# Patient Record
Sex: Female | Born: 1980
Health system: Southern US, Community
[De-identification: ages and names within clinical notes are randomized; demographics above are authoritative.]

## PROBLEM LIST (undated history)

## (undated) ENCOUNTER — Inpatient Hospital Stay (HOSPITAL_COMMUNITY): Payer: Self-pay

## (undated) DIAGNOSIS — E039 Hypothyroidism, unspecified: Secondary | ICD-10-CM

## (undated) DIAGNOSIS — F909 Attention-deficit hyperactivity disorder, unspecified type: Secondary | ICD-10-CM

## (undated) DIAGNOSIS — G43909 Migraine, unspecified, not intractable, without status migrainosus: Secondary | ICD-10-CM

## (undated) DIAGNOSIS — F419 Anxiety disorder, unspecified: Secondary | ICD-10-CM

## (undated) HISTORY — DX: Anxiety disorder, unspecified: F41.9

## (undated) HISTORY — DX: Attention-deficit hyperactivity disorder, unspecified type: F90.9

## (undated) HISTORY — DX: Migraine, unspecified, not intractable, without status migrainosus: G43.909

## (undated) HISTORY — PX: WISDOM TOOTH EXTRACTION: SHX21

---

## 2012-02-06 LAB — OB RESULTS CONSOLE ANTIBODY SCREEN: Antibody Screen: NEGATIVE

## 2012-02-06 LAB — OB RESULTS CONSOLE HEPATITIS B SURFACE ANTIGEN: Hepatitis B Surface Ag: NEGATIVE

## 2012-02-06 LAB — OB RESULTS CONSOLE RUBELLA ANTIBODY, IGM: Rubella: IMMUNE

## 2012-02-06 LAB — OB RESULTS CONSOLE RPR: RPR: NONREACTIVE

## 2012-02-06 LAB — OB RESULTS CONSOLE ABO/RH: RH Type: POSITIVE

## 2012-02-13 LAB — OB RESULTS CONSOLE GC/CHLAMYDIA: Chlamydia: NEGATIVE

## 2012-03-20 NOTE — L&D Delivery Note (Signed)
Delivery Note  First Stage: Labor onset: 1630 Augmentation : none Analgesia /Anesthesia intrapartum: none AROM at 1922  Entered tub after AROM  Water temperature  98.7  Second Stage: Complete dilation at 1925 Onset of pushing at 1925 FHR second stage 120  Delivery of a viable female at 59 by CNM in ROA position no nuchal cord Cord double clamped after cessation of pulsation, cut by provider  Patient assisted out of tub to bed  Third Stage: Collection of private cord blood donation completed Cord blood sample collected   Placenta delivered [facility] intact with 3 VC @ 1952 Placenta disposition: fro disposal Uterine tone firm / bleeding scant  no laceration identified  Est. Blood Loss (mL): 150  Complications: precipitous labor and birth - 3 hours  Mom to postpartum.  Baby to Mom for bonding.  Newborn: Birth Weight: 7 pounds - 11 ounces Apgar Scores: 7-9 Feeding planned: breast  Marlinda Mike CNM, MSN, FACNM 08/29/2012, 8:06 PM

## 2012-04-18 ENCOUNTER — Encounter (HOSPITAL_COMMUNITY): Payer: Self-pay | Admitting: *Deleted

## 2012-04-18 ENCOUNTER — Inpatient Hospital Stay (HOSPITAL_COMMUNITY)
Admission: AD | Admit: 2012-04-18 | Discharge: 2012-04-18 | Disposition: A | Discharge status: Home / Self Care | Payer: 59 | Source: Ambulatory Visit | Attending: Obstetrics | Admitting: Obstetrics

## 2012-04-18 DIAGNOSIS — O26859 Spotting complicating pregnancy, unspecified trimester: Secondary | ICD-10-CM | POA: Insufficient documentation

## 2012-04-18 DIAGNOSIS — R109 Unspecified abdominal pain: Secondary | ICD-10-CM | POA: Insufficient documentation

## 2012-04-18 HISTORY — DX: Hypothyroidism, unspecified: E03.9

## 2012-04-18 LAB — URINALYSIS, ROUTINE W REFLEX MICROSCOPIC
Ketones, ur: NEGATIVE mg/dL
Nitrite: NEGATIVE
Protein, ur: NEGATIVE mg/dL

## 2012-04-18 NOTE — MAU Note (Signed)
Dr. Ernestina Penna notified of pt.

## 2012-04-18 NOTE — MAU Note (Signed)
Dr. Cousins paged. 

## 2012-04-18 NOTE — MAU Note (Signed)
Dr. Ernestina Penna at the bedside.

## 2012-04-18 NOTE — H&P (Signed)
Chief complaint vaginal spotting  History of present illness: 32 year old G3 P1 011 at 19 weeks who notes single episode of vaginal spotting today at 5 PM while wiping. Patient had a second small amount of bright red vaginal spotting with wiping again at 6 PM. The patient called the after-hours service and instructed her to come in for evaluation. Patient notes no contractions, active fetal movement. No fevers, no vaginal discharge. Patient notes no recent vaginal exams. Last intercourse 2 weeks ago. Patient notes slight discomfort with 2 episodes of intercourse approximately 2 and 2-1/2 weeks ago. No complications in the pregnancy until the slight spotting tonight.  Patient notes normal anatomy ultrasound last week in our office. Patient reports no issues with the placenta. Patient states planned followup ultrasound in one week to reevaluate fetal spine images. Patient also gives history of hypothyroidism with increase in thyroid medicine by her endocrinologist earlier this month.  Patient denies history of preterm labor and prior cervical surgeries. No prior LEEP.   Patient notes continued nausea for the entire pregnancy. No significant change more recently. Patient also notes some slight increase in dizziness today only. No chest pains, no shortness of breath.  Past medical history: Hypothyroidism Past OB history: Full-term spontaneous vaginal delivery, early spontaneous miscarriage, current pregnancy Medications: Prenatal vitamin, Synthroid  Physical exam  Filed Vitals:   04/18/12 1927  BP: 94/56  Pulse: 98  Temp: 98.3 F (36.8 C)  TempSrc: Oral  Resp: 20  Height: 5\' 4"  (1.626 m)  Weight: 54.488 kg (120 lb 2 oz)   General: Slightly anxious, but no distress Cardiovascular regular rate and rhythm Pulmonary: Clear to auscultation bilaterally Back: No costovertebral angle tenderness Abdomen: No right upper quadrant pain, abdomen nontender, nondistended, fundus at the umbilicus, no  fundal tenderness GU: Normal external genitalia, normal vagina, no abnormal discharge or blood in the vaginal vault:, Cervix long closed, no bleeding, no significant ectropion, no adnexal masses, no uterine tenderness, Lower extremity: Nontender, no edema  Rh+  Assessment and plan: 32 year old G3 P1 at 19 weeks with vaginal spotting x2 episodes. No current bleeding. No evidence for preterm labor cervical incompetence. No evidence pelvic infection.  Unclear etiology of vaginal spotting, no concerning findings. Reassurance given.  F/u office in one week for scheduled ultrasound  Slight dizziness. Likely secondary to hypotension. Slow movements and increased hydration recommended. If dizziness continues recommend a repeat TSH due to recent dose change.  Deaken Jurgens A. 04/18/2012 10:04 PM

## 2012-04-18 NOTE — MAU Note (Signed)
PT SAYS  SHE STARTED SPOTTING AT 5PM-  WITH LIGHT CRAMPING    FEELS LIGHT HEADED-  ALL DAY.   LAST APPOINTMENT WAS 1-21.   SHE CALLED ON CALL NURSE-- LAST SEX WAS  1-2 WEEKS AGO.     NO PAD ON IN TRIAGE  .  SEES SPOTTING WHEN  SHE WIPES.

## 2012-08-13 ENCOUNTER — Inpatient Hospital Stay (HOSPITAL_COMMUNITY)
Admission: AD | Admit: 2012-08-13 | Discharge: 2012-08-13 | Disposition: A | Discharge status: Home / Self Care | Payer: 59 | Source: Ambulatory Visit | Attending: Obstetrics and Gynecology | Admitting: Obstetrics and Gynecology

## 2012-08-13 ENCOUNTER — Encounter (HOSPITAL_COMMUNITY): Payer: Self-pay | Admitting: Obstetrics

## 2012-08-13 DIAGNOSIS — O479 False labor, unspecified: Secondary | ICD-10-CM | POA: Insufficient documentation

## 2012-08-13 DIAGNOSIS — O99891 Other specified diseases and conditions complicating pregnancy: Secondary | ICD-10-CM | POA: Insufficient documentation

## 2012-08-13 NOTE — MAU Note (Signed)
Clear fluid leakage since noon.  Not a big gush, but is wetting through clothes and continues and to come. No bleeding, +FM, occ pain.

## 2012-08-13 NOTE — MAU Note (Signed)
Informed Marlinda Mike CNM of negative amniosure results. Informed CNM Fetal heart rate reactive and reassuring. Occasional irregular mild contraction/irritablity, pt not complaining of any pain with them. Orders for discharge given. Pt given third trimester pregnancy information and told to keep next appointment at office.

## 2012-08-13 NOTE — MAU Provider Note (Addendum)
History   ? Leaking fluid  Chief Complaint  Patient presents with  . Vaginal Discharge   32 yo G3P1011 MWF @ 36 2/[redacted] weeks gestation presents for evaluation of ? SROM. Pt c/o two episodes of watery fluid that soaked pantiliner. GB cx pending  OB History   Grav Para Term Preterm Abortions TAB SAB Ect Mult Living   3 1 1  1  1   1       Past Medical History  Diagnosis Date  . Hypothyroidism     Past Surgical History  Procedure Laterality Date  . Wisdom tooth extraction      Family History  Problem Relation Age of Onset  . Other Neg Hx   . Diabetes Maternal Grandmother     History  Substance Use Topics  . Smoking status: Never Smoker   . Smokeless tobacco: Not on file  . Alcohol Use: No    Allergies: Allergies not on file  No prescriptions prior to admission     Physical Exam   Blood pressure 111/70, pulse 95, temperature 99.1 F (37.3 C), temperature source Oral, resp. rate 18, height 5\' 4"  (1.626 m), weight 64.864 kg (143 lb).  BP 105/62  Pulse 90  Temp(Src) 98.3 F (36.8 C) (Axillary)  Resp 18  Ht 5\' 4"  (1.626 m)  Wt 64.864 kg (143 lb)  BMI 24.53 kg/m2 WDWN white female in NAD Abdomen: gravid soft nontender Pelvic: external genitalia normal and mucoid discharge, parous. fern done. cervix  1 cm/40/-3 post  Crist Fat neg Tracing: baseline 125-130 (+) accels reactive ED Course  ? SROM IUP @ 36 2/7week  P) amniosure If neg, d/c home  MDM   Jakelyn Squyres A, MD 7:57 PM 08/13/2012

## 2012-08-29 ENCOUNTER — Encounter (HOSPITAL_COMMUNITY): Payer: Self-pay | Admitting: *Deleted

## 2012-08-29 ENCOUNTER — Inpatient Hospital Stay (HOSPITAL_COMMUNITY)
Admission: AD | Admit: 2012-08-29 | Discharge: 2012-08-31 | DRG: 775 | Disposition: A | Discharge status: Home / Self Care | Payer: 59 | Source: Ambulatory Visit | Attending: Obstetrics and Gynecology | Admitting: Obstetrics and Gynecology

## 2012-08-29 DIAGNOSIS — E039 Hypothyroidism, unspecified: Secondary | ICD-10-CM | POA: Diagnosis present

## 2012-08-29 DIAGNOSIS — E079 Disorder of thyroid, unspecified: Principal | ICD-10-CM | POA: Diagnosis present

## 2012-08-29 LAB — CBC
HCT: 35.7 % — ABNORMAL LOW (ref 36.0–46.0)
Hemoglobin: 11.9 g/dL — ABNORMAL LOW (ref 12.0–15.0)
MCH: 27.7 pg (ref 26.0–34.0)
MCHC: 33.3 g/dL (ref 30.0–36.0)
MCV: 83.2 fL (ref 78.0–100.0)
Platelets: 201 10*3/uL (ref 150–400)
RBC: 4.29 MIL/uL (ref 3.87–5.11)
RDW: 13.7 % (ref 11.5–15.5)
WBC: 14.1 10*3/uL — ABNORMAL HIGH (ref 4.0–10.5)

## 2012-08-29 MED ORDER — LEVOTHYROXINE SODIUM 100 MCG PO TABS
100.0000 ug | ORAL_TABLET | Freq: Every day | ORAL | Status: DC
  Administered 2012-08-30 – 2012-08-31 (×2): 100 ug via ORAL
  Filled 2012-08-29 (×2): qty 1

## 2012-08-29 MED ORDER — FAMOTIDINE 20 MG PO TABS
20.0000 mg | ORAL_TABLET | Freq: Every day | ORAL | Status: DC
  Administered 2012-08-30 – 2012-08-31 (×2): 20 mg via ORAL
  Filled 2012-08-29 (×2): qty 1

## 2012-08-29 MED ORDER — IBUPROFEN 600 MG PO TABS: 600.0000 mg | ORAL_TABLET | Freq: Four times a day (QID) | ORAL | Status: DC | PRN

## 2012-08-29 MED ORDER — ACETAMINOPHEN 325 MG PO TABS: 650.0000 mg | ORAL_TABLET | ORAL | Status: DC | PRN

## 2012-08-29 MED ORDER — OXYCODONE-ACETAMINOPHEN 5-325 MG PO TABS
1.0000 | ORAL_TABLET | ORAL | Status: DC | PRN
  Administered 2012-08-29: 1 via ORAL
  Administered 2012-08-30 (×2): 2 via ORAL
  Administered 2012-08-30: 1 via ORAL
  Administered 2012-08-30: 2 via ORAL
  Filled 2012-08-29: qty 2
  Filled 2012-08-29 (×2): qty 1
  Filled 2012-08-29 (×2): qty 2

## 2012-08-29 MED ORDER — DIBUCAINE 1 % RE OINT: 1.0000 "application " | TOPICAL_OINTMENT | RECTAL | Status: DC | PRN

## 2012-08-29 MED ORDER — OXYTOCIN 10 UNIT/ML IJ SOLN: 10.0000 [IU] | Freq: Once | INTRAMUSCULAR | Status: DC

## 2012-08-29 MED ORDER — ACETAMINOPHEN 500 MG PO TABS: 500.0000 mg | ORAL_TABLET | ORAL | Status: DC | PRN

## 2012-08-29 MED ORDER — WITCH HAZEL-GLYCERIN EX PADS: 1.0000 "application " | MEDICATED_PAD | CUTANEOUS | Status: DC | PRN

## 2012-08-29 MED ORDER — ONDANSETRON HCL 4 MG PO TABS: 4.0000 mg | ORAL_TABLET | ORAL | Status: DC | PRN

## 2012-08-29 MED ORDER — ESCITALOPRAM OXALATE 5 MG PO TABS
5.0000 mg | ORAL_TABLET | Freq: Every day | ORAL | Status: DC
  Administered 2012-08-29: 5 mg via ORAL
  Administered 2012-08-30: 23:00:00 via ORAL
  Filled 2012-08-29 (×2): qty 1

## 2012-08-29 MED ORDER — ONDANSETRON HCL 4 MG PO TABS
4.0000 mg | ORAL_TABLET | Freq: Once | ORAL | Status: DC
  Filled 2012-08-29: qty 1

## 2012-08-29 MED ORDER — BENZOCAINE-MENTHOL 20-0.5 % EX AERO
1.0000 "application " | INHALATION_SPRAY | CUTANEOUS | Status: DC | PRN
  Administered 2012-08-29: 1 via TOPICAL
  Filled 2012-08-29: qty 56

## 2012-08-29 MED ORDER — SENNOSIDES-DOCUSATE SODIUM 8.6-50 MG PO TABS
2.0000 | ORAL_TABLET | Freq: Every day | ORAL | Status: DC
  Administered 2012-08-29: 2 via ORAL

## 2012-08-29 MED ORDER — LANOLIN HYDROUS EX OINT: TOPICAL_OINTMENT | CUTANEOUS | Status: DC | PRN

## 2012-08-29 MED ORDER — LIDOCAINE HCL (PF) 1 % IJ SOLN
30.0000 mL | INTRAMUSCULAR | Status: DC | PRN
  Filled 2012-08-29: qty 30

## 2012-08-29 MED ORDER — IBUPROFEN 600 MG PO TABS
600.0000 mg | ORAL_TABLET | Freq: Four times a day (QID) | ORAL | Status: DC
  Administered 2012-08-29 – 2012-08-31 (×6): 600 mg via ORAL
  Filled 2012-08-29 (×6): qty 1

## 2012-08-29 MED ORDER — ONDANSETRON HCL 4 MG/2ML IJ SOLN: 4.0000 mg | INTRAMUSCULAR | Status: DC | PRN

## 2012-08-29 MED ORDER — DIPHENHYDRAMINE HCL 25 MG PO CAPS: 25.0000 mg | ORAL_CAPSULE | Freq: Four times a day (QID) | ORAL | Status: DC | PRN

## 2012-08-29 MED ORDER — CITRIC ACID-SODIUM CITRATE 334-500 MG/5ML PO SOLN: 30.0000 mL | ORAL | Status: DC | PRN

## 2012-08-29 NOTE — H&P (Signed)
  OB ADMISSION/ HISTORY & PHYSICAL:  Admission Date: 08/29/2012  6:31 PM  Admit Diagnosis: 38 weeks onset of labor / hypothyroidism   Heidi Walker is a 32 y.o. female presenting for labor onset around 4pm.  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 Hypothyroidism      Past surgical history:  Past Surgical History  Procedure Laterality Date  . Wisdom tooth extraction      Family History:  Family History  Problem Relation Age of Onset  . Other Neg Hx   . Diabetes Maternal Grandmother      Social History:  reports that she has never smoked. She does not have any smokeless tobacco history on file. She reports that she does not drink alcohol or use illicit drugs.  Allergies: Review of patient's allergies indicates no known allergies.   Current Medications at time of admission:  Prenatal vitamin daily Synthroid daily Lexapro 5mg  daily  EPO 1000mg  BID red raspberry tea   Review of Systems: Irregular ctx x 24 hours - regular and painful ctx since 1600pm No LOF + bloody show  Physical Exam:  VS: There were no vitals taken for this visit.  General: alert and oriented, appears uncomfortable with ctx / nausea with vomiting Heart: RRR Lungs: Clear lung fields Abdomen: Gravid, soft and non-tender, non-distended / uterus: gravid - ctx firm Extremities: no edema  Genitalia / VE:  8 / 90% / vtx / 0 BBOW  FHR: baseline rate 140 / variability moderate / accelerations + / no decelerations TOCO: ctx every 2  minutes  Assessment: [redacted]  weeks gestation - desires waterbirth Active stage of labor FHR category 1    Plan:  Admit Water birth planned - AROM prior to tub entry  Dr Cherly Hensen notified of admission / plan of care   Marlinda Mike CNM, MSN, [facility] 08/29/2012, 6:44 PM   Hx reviewed. Present for delivery

## 2012-08-30 LAB — RPR: RPR Ser Ql: NONREACTIVE

## 2012-08-30 NOTE — Progress Notes (Signed)
PPD 1 SVD  S:  Reports feeling well - muscles sore only             Tolerating po/ No nausea or vomiting             Bleeding is light             Pain controlled with motrin and percocet             Up ad lib / ambulatory / voiding QS  Newborn breast feeding  / Circumcision planned today O:               VS: BP 114/79  Pulse 73  Temp(Src) 98.4 F (36.9 C) (Oral)  Resp 18  Ht 5\' 4"  (1.626 m)  Wt 65.318 kg (144 lb)  BMI 24.71 kg/m2   LABS:  Recent Labs  08/29/12 1910  WBC 14.1*  HGB 11.9*  PLT 201                Physical Exam:             Alert and oriented X3  Abdomen: soft, non-tender, non-distended              Fundus: firm, non-tender, U-1  Perineum: intact / mild edema  Lochia: light  Extremities: no edema, no calf pain or tenderness    A: PPD # 1 waterbirth  Doing well - stable status  P:  Routine post partum orders  Anticipate Dc tomorrow  Marlinda Mike CNM, MSN, [facility] 08/30/2012, 9:35 AM

## 2012-08-30 NOTE — Progress Notes (Signed)

## 2012-08-31 MED ORDER — IBUPROFEN 600 MG PO TABS: 600.0000 mg | ORAL_TABLET | Freq: Four times a day (QID) | ORAL | Status: DC

## 2012-08-31 MED ORDER — OXYCODONE-ACETAMINOPHEN 5-325 MG PO TABS: 1.0000 | ORAL_TABLET | ORAL | Status: DC | PRN

## 2012-08-31 NOTE — Progress Notes (Signed)
PPD 2 SVD  S:  Reports feeling well - ready to go home             Tolerating po/ No nausea or vomiting             Bleeding is light             Pain controlled with motrin and percocet / still a lot of cramps but better today / + muscle fatigue             Up ad lib / ambulatory / voiding QS  Newborn breast feeding  / Circumcision today O:               VS: BP 95/68  Pulse 77  Temp(Src) 98.3 F (36.8 C) (Oral)  Resp 18  Ht 5\' 4"  (1.626 m)  Wt 65.318 kg (144 lb)  BMI 24.71 kg/m2    Physical Exam:             Alert and oriented X3  Abdomen: soft, non-tender, non-distended              Fundus: firm, non-tender, U-1  Perineum: intact  Lochia: spotting  Extremities: no edema, no calf pain or tenderness    A: PPD # 2   Doing well - stable status  P: Routine post partum orders  Dc home  Marlinda Mike CNM, MSN, [facility] 08/31/2012, 9:12 AM

## 2012-08-31 NOTE — Discharge Summary (Signed)
Obstetric Discharge Summary  Reason for Admission: onset of labor Prenatal Procedures: none Intrapartum Procedures: spontaneous vaginal delivery after precipitous labor of less than 3 hours Postpartum Procedures: none Complications-Operative and Postpartum: none Hemoglobin  Date Value Range Status  08/29/2012 11.9* 12.0 - 15.0 g/dL Final     HCT  Date Value Range Status  08/29/2012 35.7* 36.0 - 46.0 % Final    Physical Exam:  General: alert, cooperative and fatigued Lochia: appropriate Uterine Fundus: firm DVT Evaluation: No evidence of DVT seen on physical exam.  Discharge Diagnoses: Term Pregnancy-delivered  / water birth  Discharge Information: Date: 08/31/2012 Activity: pelvic rest Diet: routine Medications: PNV, Ibuprofen, Percocet and Lexapro Condition: stable Instructions: refer to practice specific booklet Discharge to: home Follow-up Information   Follow up with Tallan Sandoz A, MD. Schedule an appointment as soon as possible for a visit in 6 weeks.   Contact information:   [address] [place] [address] [phone]       Newborn Data: Live born female  Birth Weight: 7 lb 10.6 oz (3475 g) APGAR: 7, 9  Home with mother.  Marlinda Mike 08/31/2012, 9:15 AM

## 2014-01-19 ENCOUNTER — Encounter (HOSPITAL_COMMUNITY): Payer: Self-pay | Admitting: *Deleted

## 2015-03-24 MED FILL — ESCITALOPRAM 10 MG TABLET: 10 | 90 days supply | Qty: 90 | Fill #0

## 2015-04-01 ENCOUNTER — Other Ambulatory Visit (HOSPITAL_COMMUNITY): Payer: Self-pay | Admitting: Family Medicine

## 2015-04-01 DIAGNOSIS — M542 Cervicalgia: Secondary | ICD-10-CM | POA: Diagnosis not present

## 2015-04-01 DIAGNOSIS — R202 Paresthesia of skin: Secondary | ICD-10-CM | POA: Diagnosis not present

## 2015-04-01 DIAGNOSIS — Z789 Other specified health status: Secondary | ICD-10-CM | POA: Diagnosis not present

## 2015-04-01 MED FILL — NAPROXEN 500 MG TABLET: 500 | 30 days supply | Qty: 60 | Fill #0

## 2015-04-01 MED FILL — NORETHINDRONE 0.35 MG TAB: 0.35 | 84 days supply | Qty: 84 | Fill #0

## 2015-04-01 MED FILL — CYCLOBENZAPRINE 10 MG TAB: 10 | 30 days supply | Qty: 30 | Fill #0

## 2015-04-06 ENCOUNTER — Ambulatory Visit (HOSPITAL_COMMUNITY)
Admission: RE | Admit: 2015-04-06 | Discharge: 2015-04-06 | Disposition: A | Discharge status: Home / Self Care | Payer: 59 | Source: Ambulatory Visit | Attending: Family Medicine | Admitting: Family Medicine

## 2015-04-06 DIAGNOSIS — M47812 Spondylosis without myelopathy or radiculopathy, cervical region: Secondary | ICD-10-CM | POA: Insufficient documentation

## 2015-04-06 DIAGNOSIS — M542 Cervicalgia: Secondary | ICD-10-CM | POA: Diagnosis not present

## 2015-04-06 DIAGNOSIS — M50223 Other cervical disc displacement at C6-C7 level: Secondary | ICD-10-CM | POA: Diagnosis not present

## 2015-04-06 DIAGNOSIS — M50222 Other cervical disc displacement at C5-C6 level: Secondary | ICD-10-CM | POA: Diagnosis not present

## 2015-04-06 DIAGNOSIS — M50221 Other cervical disc displacement at C4-C5 level: Secondary | ICD-10-CM | POA: Insufficient documentation

## 2015-04-06 DIAGNOSIS — M2578 Osteophyte, vertebrae: Secondary | ICD-10-CM | POA: Insufficient documentation

## 2015-05-05 DIAGNOSIS — H5213 Myopia, bilateral: Secondary | ICD-10-CM | POA: Diagnosis not present

## 2015-05-05 DIAGNOSIS — H52223 Regular astigmatism, bilateral: Secondary | ICD-10-CM | POA: Diagnosis not present

## 2015-05-10 MED FILL — LEVOTHYROXINE 75 MCG TABLET: 75 | 90 days supply | Qty: 90 | Fill #2

## 2015-06-21 DIAGNOSIS — E039 Hypothyroidism, unspecified: Secondary | ICD-10-CM | POA: Diagnosis not present

## 2015-06-23 MED FILL — NORETHINDRONE 0.35 MG TAB: 0.35 | 84 days supply | Qty: 84 | Fill #1

## 2015-06-23 MED FILL — ESCITALOPRAM 10 MG TABLET: 10 | 90 days supply | Qty: 90 | Fill #1

## 2015-06-28 DIAGNOSIS — E039 Hypothyroidism, unspecified: Secondary | ICD-10-CM | POA: Diagnosis not present

## 2015-07-06 DIAGNOSIS — F411 Generalized anxiety disorder: Secondary | ICD-10-CM | POA: Diagnosis not present

## 2015-07-08 DIAGNOSIS — G43009 Migraine without aura, not intractable, without status migrainosus: Secondary | ICD-10-CM | POA: Diagnosis not present

## 2015-07-08 DIAGNOSIS — F419 Anxiety disorder, unspecified: Secondary | ICD-10-CM | POA: Diagnosis not present

## 2015-07-08 MED FILL — SUMATRIPTAN SUCC 100 MG TAB: 100 | 30 days supply | Qty: 9 | Fill #0

## 2015-07-21 DIAGNOSIS — F411 Generalized anxiety disorder: Secondary | ICD-10-CM | POA: Diagnosis not present

## 2015-08-17 MED FILL — LEVOTHYROXINE 75 MCG TABLET: 75 | 90 days supply | Qty: 90 | Fill #3

## 2015-08-17 MED FILL — ESCITALOPRAM 20 MG TABLET: 20 | 90 days supply | Qty: 90 | Fill #0

## 2015-08-18 MED FILL — CYCLOBENZAPRINE 10 MG TAB: 10 | 30 days supply | Qty: 30 | Fill #1

## 2015-08-19 DIAGNOSIS — F411 Generalized anxiety disorder: Secondary | ICD-10-CM | POA: Diagnosis not present

## 2015-08-25 DIAGNOSIS — F411 Generalized anxiety disorder: Secondary | ICD-10-CM | POA: Diagnosis not present

## 2015-09-08 DIAGNOSIS — F411 Generalized anxiety disorder: Secondary | ICD-10-CM | POA: Diagnosis not present

## 2015-09-15 DIAGNOSIS — G44209 Tension-type headache, unspecified, not intractable: Secondary | ICD-10-CM | POA: Diagnosis not present

## 2015-09-15 DIAGNOSIS — F411 Generalized anxiety disorder: Secondary | ICD-10-CM | POA: Diagnosis not present

## 2015-09-15 DIAGNOSIS — G43909 Migraine, unspecified, not intractable, without status migrainosus: Secondary | ICD-10-CM | POA: Diagnosis not present

## 2015-09-15 MED FILL — LORazepam 0.5 MG TABS: 0.5 | 10 days supply | Qty: 30 | Fill #0

## 2015-09-15 MED FILL — RIZATRIPTAN 10 MG TABLET: 10 | 30 days supply | Qty: 9 | Fill #0

## 2015-09-15 MED FILL — NAPROXEN 500 MG TABLET: 500 | 30 days supply | Qty: 60 | Fill #0

## 2015-09-15 MED FILL — CYCLOBENZAPRINE 10 MG TAB: 10 | 30 days supply | Qty: 30 | Fill #0

## 2015-09-17 MED FILL — NORETHINDRONE 0.35 MG TAB: 0.35 | 84 days supply | Qty: 84 | Fill #2

## 2015-09-29 DIAGNOSIS — F411 Generalized anxiety disorder: Secondary | ICD-10-CM | POA: Diagnosis not present

## 2015-10-27 DIAGNOSIS — F411 Generalized anxiety disorder: Secondary | ICD-10-CM | POA: Diagnosis not present

## 2015-11-01 MED FILL — RIZATRIPTAN 10 MG TABLET: 10 | 30 days supply | Qty: 9 | Fill #1

## 2015-11-10 DIAGNOSIS — F411 Generalized anxiety disorder: Secondary | ICD-10-CM | POA: Diagnosis not present

## 2015-11-10 MED FILL — ESCITALOPRAM 20 MG TABLET: 20 | 90 days supply | Qty: 90 | Fill #1

## 2015-11-10 MED FILL — LEVOTHYROXINE 75 MCG TABLET: 75 | 90 days supply | Qty: 90 | Fill #0

## 2015-12-02 DIAGNOSIS — F411 Generalized anxiety disorder: Secondary | ICD-10-CM | POA: Diagnosis not present

## 2015-12-08 DIAGNOSIS — F411 Generalized anxiety disorder: Secondary | ICD-10-CM | POA: Diagnosis not present

## 2015-12-22 DIAGNOSIS — F411 Generalized anxiety disorder: Secondary | ICD-10-CM | POA: Diagnosis not present

## 2015-12-31 DIAGNOSIS — R1031 Right lower quadrant pain: Secondary | ICD-10-CM | POA: Diagnosis not present

## 2016-01-03 DIAGNOSIS — Z1151 Encounter for screening for human papillomavirus (HPV): Secondary | ICD-10-CM | POA: Diagnosis not present

## 2016-01-03 DIAGNOSIS — Z6820 Body mass index (BMI) 20.0-20.9, adult: Secondary | ICD-10-CM | POA: Diagnosis not present

## 2016-01-03 DIAGNOSIS — Z01419 Encounter for gynecological examination (general) (routine) without abnormal findings: Secondary | ICD-10-CM | POA: Diagnosis not present

## 2016-01-03 MED FILL — RIZATRIPTAN 10 MG TABLET: 10 | 30 days supply | Qty: 9 | Fill #2

## 2016-01-19 DIAGNOSIS — F411 Generalized anxiety disorder: Secondary | ICD-10-CM | POA: Diagnosis not present

## 2016-01-27 DIAGNOSIS — F411 Generalized anxiety disorder: Secondary | ICD-10-CM | POA: Diagnosis not present

## 2016-02-08 MED FILL — ESCITALOPRAM 20 MG TABLET: 20 | 90 days supply | Qty: 90 | Fill #0

## 2016-02-16 DIAGNOSIS — F411 Generalized anxiety disorder: Secondary | ICD-10-CM | POA: Diagnosis not present

## 2016-02-17 MED FILL — LEVOTHYROXINE 75 MCG TABLET: 75 | 90 days supply | Qty: 90 | Fill #1

## 2016-03-01 DIAGNOSIS — F411 Generalized anxiety disorder: Secondary | ICD-10-CM | POA: Diagnosis not present

## 2016-03-09 DIAGNOSIS — J029 Acute pharyngitis, unspecified: Secondary | ICD-10-CM | POA: Diagnosis not present

## 2016-03-27 DIAGNOSIS — G44209 Tension-type headache, unspecified, not intractable: Secondary | ICD-10-CM | POA: Diagnosis not present

## 2016-03-27 DIAGNOSIS — F419 Anxiety disorder, unspecified: Secondary | ICD-10-CM | POA: Diagnosis not present

## 2016-03-27 DIAGNOSIS — Z23 Encounter for immunization: Secondary | ICD-10-CM | POA: Diagnosis not present

## 2016-03-27 DIAGNOSIS — G43009 Migraine without aura, not intractable, without status migrainosus: Secondary | ICD-10-CM | POA: Diagnosis not present

## 2016-04-12 DIAGNOSIS — F411 Generalized anxiety disorder: Secondary | ICD-10-CM | POA: Diagnosis not present

## 2016-04-25 DIAGNOSIS — F411 Generalized anxiety disorder: Secondary | ICD-10-CM | POA: Diagnosis not present

## 2016-04-28 MED FILL — ESCITALOPRAM 20 MG TABLET: 20 | 90 days supply | Qty: 90 | Fill #0

## 2016-05-01 MED FILL — LEVOTHYROXINE 75 MCG TABLET: 75 | 90 days supply | Qty: 90 | Fill #2

## 2016-05-08 DIAGNOSIS — Z30430 Encounter for insertion of intrauterine contraceptive device: Secondary | ICD-10-CM | POA: Diagnosis not present

## 2016-05-09 DIAGNOSIS — F411 Generalized anxiety disorder: Secondary | ICD-10-CM | POA: Diagnosis not present

## 2016-05-09 DIAGNOSIS — H52223 Regular astigmatism, bilateral: Secondary | ICD-10-CM | POA: Diagnosis not present

## 2016-05-09 DIAGNOSIS — H5213 Myopia, bilateral: Secondary | ICD-10-CM | POA: Diagnosis not present

## 2016-06-06 DIAGNOSIS — F411 Generalized anxiety disorder: Secondary | ICD-10-CM | POA: Diagnosis not present

## 2016-06-07 DIAGNOSIS — T8332XA Displacement of intrauterine contraceptive device, initial encounter: Secondary | ICD-10-CM | POA: Diagnosis not present

## 2016-06-07 DIAGNOSIS — Z30431 Encounter for routine checking of intrauterine contraceptive device: Secondary | ICD-10-CM | POA: Diagnosis not present

## 2016-06-26 MED FILL — RIZATRIPTAN 10 MG TABLET: 10 | 30 days supply | Qty: 9 | Fill #3

## 2016-06-26 MED FILL — LORazepam 0.5 MG TABS: 0.5 | 10 days supply | Qty: 30 | Fill #0

## 2016-07-12 DIAGNOSIS — F411 Generalized anxiety disorder: Secondary | ICD-10-CM | POA: Diagnosis not present

## 2016-07-19 DIAGNOSIS — F411 Generalized anxiety disorder: Secondary | ICD-10-CM | POA: Diagnosis not present

## 2016-07-26 DIAGNOSIS — E039 Hypothyroidism, unspecified: Secondary | ICD-10-CM | POA: Diagnosis not present

## 2016-07-31 DIAGNOSIS — E039 Hypothyroidism, unspecified: Secondary | ICD-10-CM | POA: Diagnosis not present

## 2016-08-02 DIAGNOSIS — F411 Generalized anxiety disorder: Secondary | ICD-10-CM | POA: Diagnosis not present

## 2016-08-17 MED FILL — LEVOTHYROXINE 75 MCG TABLET: 75 | 90 days supply | Qty: 90 | Fill #0

## 2016-08-17 MED FILL — RIZATRIPTAN 10 MG TABLET: 10 | 30 days supply | Qty: 9 | Fill #4

## 2016-08-17 MED FILL — ESCITALOPRAM 20 MG TABLET: 20 | 90 days supply | Qty: 90 | Fill #1

## 2016-08-23 DIAGNOSIS — F411 Generalized anxiety disorder: Secondary | ICD-10-CM | POA: Diagnosis not present

## 2016-10-12 DIAGNOSIS — N644 Mastodynia: Secondary | ICD-10-CM | POA: Diagnosis not present

## 2016-10-16 DIAGNOSIS — F411 Generalized anxiety disorder: Secondary | ICD-10-CM | POA: Diagnosis not present

## 2016-11-10 MED FILL — RIZATRIPTAN 10 MG TABLET: 10 | 30 days supply | Qty: 9 | Fill #0

## 2016-11-10 MED FILL — ESCITALOPRAM 20 MG TABLET: 20 | 90 days supply | Qty: 90 | Fill #0

## 2016-11-10 MED FILL — LEVOTHYROXINE 75 MCG TABLET: 75 | 90 days supply | Qty: 90 | Fill #1

## 2016-11-19 ENCOUNTER — Ambulatory Visit (HOSPITAL_COMMUNITY)
Admission: EM | Admit: 2016-11-19 | Discharge: 2016-11-19 | Disposition: A | Discharge status: Home / Self Care | Payer: 59 | Attending: Physician Assistant | Admitting: Physician Assistant

## 2016-11-19 ENCOUNTER — Encounter (HOSPITAL_COMMUNITY): Payer: Self-pay | Admitting: *Deleted

## 2016-11-19 ENCOUNTER — Ambulatory Visit (INDEPENDENT_AMBULATORY_CARE_PROVIDER_SITE_OTHER): Payer: 59

## 2016-11-19 DIAGNOSIS — M25561 Pain in right knee: Secondary | ICD-10-CM

## 2016-11-19 DIAGNOSIS — G8929 Other chronic pain: Secondary | ICD-10-CM

## 2016-11-19 MED ORDER — MELOXICAM 15 MG PO TABS: 7.5000 mg | ORAL_TABLET | Freq: Every day | ORAL | 0 refills | Status: AC

## 2016-11-19 NOTE — ED Provider Notes (Addendum)
11/19/2016 2:19 PM   DOB: 01/02/1981 / MRN: [id number]  SUBJECTIVE:  Heidi Walker is a 36 y.o. female presenting for chornic knee paint that has been catching and has been having some sharp pain, mostly with straightening.  Assoicates some swelling and tells the pain is a sore sensation that is mostly lateral.  Has tried a knee brace with some relief.    She has No Known Allergies.   She  has a past medical history of Hypothyroidism.    She  reports that she has never smoked. She does not have any smokeless tobacco history on file. She reports that she does not drink alcohol or use drugs. She  reports that she currently engages in sexual activity. The patient  has a past surgical history that includes Wisdom tooth extraction.  Her family history includes Diabetes in her maternal grandmother.  Review of Systems  Constitutional: Negative for chills, diaphoresis and fever.  Gastrointestinal: Negative for nausea.  Musculoskeletal: Positive for joint pain. Negative for back pain and falls.  Skin: Negative for rash.  Neurological: Negative for dizziness.    OBJECTIVE:  BP 124/80 (BP Location: Right Arm)   Pulse 72   Temp 98.6 F (37 C) (Oral)   Resp 18   LMP 10/03/2016 (Exact Date) Comment: irregular LMP, has an IUD  SpO2 100%   Breastfeeding? No Comment: iud  Physical Exam  Constitutional: She is active.  Non-toxic appearance.  Cardiovascular: Normal rate.   Pulmonary/Chest: Effort normal. No tachypnea.  Musculoskeletal: She exhibits tenderness (lateral right knee, ligament intact, ROM and active strength full, no signs of effusion, + lateral McMurray's. ).  Neurological: She is alert.  Skin: Skin is warm and dry. She is not diaphoretic. No pallor.    No results found for this or any previous visit (from the past 72 hour(s)).  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 No recent injuries. EXAM: RIGHT KNEE - COMPLETE 4+ VIEW COMPARISON:  None. FINDINGS: No evidence of acute fracture or dislocation. Well-preserved joint spaces. Well-preserved bone mineral density. Benign bone island in the lateral femoral condyle. No significant intrinsic osseous abnormality. No visible joint effusion. IMPRESSION: No acute or significant osseous abnormality. Electronically Signed   By: Hulan Saashomas  Lawrence M.D.   On: 11/19/2016 14:12    ASSESSMENT AND PLAN:  The encounter diagnosis was Chronic pain of right knee. I have referred her to an orthopod as she will likely need further work up and chronic management.  She will continue the brace and start meloxicam for now.     The patient is advised to call or return to clinic if she does not see an improvement in symptoms, or to seek the care of the closest emergency department if she worsens with the above plan.   Deliah BostonMichael Clark, MHS, PA-C 11/19/2016 2:19 PM    Ofilia Neaslark, Michael L, PA-C 11/19/16 1421    Ofilia Neaslark, Michael L, PA-C 11/19/16 1422

## 2016-11-19 NOTE — Discharge Instructions (Signed)
NSAIDS, ice and elevation.  You knee will most likely need an MRI eventually.  Please call the orthopedic doctor for further management. If they won't accept a referral from an Urgent Care then please see your PCP to get the referral.

## 2016-11-19 NOTE — ED Triage Notes (Signed)
Pt  Has   A   History      Of  Knee                   Pt  Reports    Pain     X  3   Days     Pt pain  Is  Worse  On  Leg   straightning  Pain  Is  A  Dull  Ache   Which  Is      Constant        Pt  Has  A  Brace     On  pta

## 2016-11-23 DIAGNOSIS — M222X1 Patellofemoral disorders, right knee: Secondary | ICD-10-CM | POA: Diagnosis not present

## 2016-12-19 DIAGNOSIS — M25561 Pain in right knee: Secondary | ICD-10-CM | POA: Diagnosis not present

## 2016-12-22 MED FILL — RIZATRIPTAN 10 MG TABLET: 10 | 30 days supply | Qty: 9 | Fill #1

## 2017-02-19 MED FILL — RIZATRIPTAN 10 MG TABLET: 10 | 30 days supply | Qty: 9 | Fill #2

## 2017-02-19 MED FILL — LEVOTHYROXINE 75 MCG TABLET: 75 | 90 days supply | Qty: 90 | Fill #2

## 2017-02-20 MED FILL — ESCITALOPRAM 20 MG TABLET: 20 | 30 days supply | Qty: 30 | Fill #0

## 2017-02-20 MED FILL — LORazepam 0.5 MG TABS: 0.5 | 10 days supply | Qty: 30 | Fill #0

## 2017-02-23 DIAGNOSIS — N6320 Unspecified lump in the left breast, unspecified quadrant: Secondary | ICD-10-CM | POA: Diagnosis not present

## 2017-03-03 ENCOUNTER — Ambulatory Visit: Payer: Self-pay | Admitting: Emergency Medicine

## 2017-03-03 VITALS — BP 100/74 | HR 89 | Temp 98.6°F | Resp 18 | Wt 123.2 lb

## 2017-03-03 DIAGNOSIS — N3001 Acute cystitis with hematuria: Secondary | ICD-10-CM

## 2017-03-03 LAB — POCT URINALYSIS DIPSTICK
BILIRUBIN UA: NEGATIVE
GLUCOSE UA: NEGATIVE
KETONES UA: NEGATIVE
Nitrite, UA: NEGATIVE
Protein, UA: POSITIVE
RBC UA: POSITIVE
SPEC GRAV UA: 1.01 (ref 1.010–1.025)
Urobilinogen, UA: 1 E.U./dL
pH, UA: 6.5 (ref 5.0–8.0)

## 2017-03-03 MED ORDER — SULFAMETHOXAZOLE-TRIMETHOPRIM 800-160 MG PO TABS: 1.0000 | ORAL_TABLET | Freq: Two times a day (BID) | ORAL | 0 refills | Status: DC

## 2017-03-03 MED ORDER — PHENAZOPYRIDINE HCL 200 MG PO TABS: 200.0000 mg | ORAL_TABLET | Freq: Three times a day (TID) | ORAL | 1 refills | Status: DC | PRN

## 2017-03-03 NOTE — Progress Notes (Signed)
SUBJECTIVE: Heidi Walker is a 36 y.o. female who complains of urinary frequency, urgency and dysuria x 2 days, without flank pain, fever, chills, or abnormal vaginal discharge or bleeding. LMP 1 week ago. Denies pregnancy, has IUD in place. Not currently breast feeding.  OBJECTIVE:   Vitals:   03/03/17 1131  BP: 100/74  Pulse: 89  Resp: 18  Temp: 98.6 F (37 C)  SpO2: 97%    Appears well, in no apparent distress.  Vital signs are normal. The abdomen is soft without tenderness, guarding, mass, rebound or organomegaly. No CVA tenderness Urine dipstick shows positive for RBC's, positive for protein, positive for leukocytes and positive for urobilinogen.  Micro exam: not done.   ASSESSMENT: UTI uncomplicated without evidence of pyelonephritis  PLAN:   1. Acute cystitis with hematuria - sulfamethoxazole-trimethoprim (BACTRIM DS) 800-160 MG tablet; Take 1 tablet by mouth 2 (two) times daily.  Dispense: 10 tablet; Refill: 0 - phenazopyridine (PYRIDIUM) 200 MG tablet; Take 1 tablet (200 mg total) by mouth 3 (three) times daily as needed for pain.  Dispense: 20 tablet; Refill: 1 - POCT urinalysis dipstick

## 2017-03-03 NOTE — Patient Instructions (Signed)

## 2017-03-03 NOTE — Progress Notes (Signed)
Pt came in and had a pain when urinate . Took UTI test which was positive.

## 2017-03-05 ENCOUNTER — Telehealth: Payer: Self-pay

## 2017-03-05 NOTE — Telephone Encounter (Signed)
I left a message to the patient asking to call us back. 

## 2017-03-06 ENCOUNTER — Telehealth: Payer: Self-pay | Admitting: Emergency Medicine

## 2017-03-06 DIAGNOSIS — G43009 Migraine without aura, not intractable, without status migrainosus: Secondary | ICD-10-CM | POA: Diagnosis not present

## 2017-03-06 DIAGNOSIS — F419 Anxiety disorder, unspecified: Secondary | ICD-10-CM | POA: Diagnosis not present

## 2017-03-06 MED FILL — VENLAFAXINE HCL ER 75 MG CA: 75 | 7 days supply | Qty: 7 | Fill #0

## 2017-03-06 MED FILL — VENLAFAXINE HCL ER 150 MG C: 150 | 30 days supply | Qty: 30 | Fill #0

## 2017-03-06 MED FILL — LORazepam 0.5 MG TABS: 0.5 | 10 days supply | Qty: 30 | Fill #0

## 2017-03-06 MED FILL — NARATRIPTAN HCL 2.5 MG TAB: 2.5 | 30 days supply | Qty: 8 | Fill #0

## 2017-03-29 DIAGNOSIS — R922 Inconclusive mammogram: Secondary | ICD-10-CM | POA: Diagnosis not present

## 2017-03-29 DIAGNOSIS — N6321 Unspecified lump in the left breast, upper outer quadrant: Secondary | ICD-10-CM | POA: Diagnosis not present

## 2017-04-16 MED FILL — VENLAFAXINE HCL ER 150 MG C: 150 | 30 days supply | Qty: 30 | Fill #1

## 2017-04-16 MED FILL — NARATRIPTAN HCL 2.5 MG TAB: 2.5 | 30 days supply | Qty: 8 | Fill #1

## 2017-05-15 MED FILL — VENLAFAXINE HCL ER 75 MG CA: 75 | 30 days supply | Qty: 90 | Fill #0

## 2017-05-17 MED FILL — NARATRIPTAN HCL 2.5 MG TAB: 2.5 | 14 days supply | Qty: 4 | Fill #1

## 2017-05-17 MED FILL — NARATRIPTAN HCL 2.5 MG TAB: 2.5 | 30 days supply | Qty: 8 | Fill #0

## 2017-05-24 MED FILL — LEVOTHYROXINE 75 MCG TABLET: 75 | 90 days supply | Qty: 90 | Fill #3

## 2017-06-18 MED FILL — VENLAFAXINE HCL ER 75 MG CA: 75 | 30 days supply | Qty: 90 | Fill #1

## 2017-07-13 DIAGNOSIS — Z30432 Encounter for removal of intrauterine contraceptive device: Secondary | ICD-10-CM | POA: Diagnosis not present

## 2017-07-13 DIAGNOSIS — N938 Other specified abnormal uterine and vaginal bleeding: Secondary | ICD-10-CM | POA: Diagnosis not present

## 2017-07-13 DIAGNOSIS — T8332XA Displacement of intrauterine contraceptive device, initial encounter: Secondary | ICD-10-CM | POA: Diagnosis not present

## 2017-07-20 MED FILL — NARATRIPTAN HCL 2.5 MG TAB: 2.5 | 30 days supply | Qty: 8 | Fill #2

## 2017-07-20 MED FILL — VENLAFAXINE HCL ER 75 MG CA: 75 | 30 days supply | Qty: 90 | Fill #2

## 2017-07-24 DIAGNOSIS — E039 Hypothyroidism, unspecified: Secondary | ICD-10-CM | POA: Diagnosis not present

## 2017-07-31 DIAGNOSIS — F411 Generalized anxiety disorder: Secondary | ICD-10-CM | POA: Diagnosis not present

## 2017-07-31 DIAGNOSIS — E039 Hypothyroidism, unspecified: Secondary | ICD-10-CM | POA: Diagnosis not present

## 2017-07-31 MED FILL — LEVOTHYROXINE 88 MCG TABLET: 88 | 30 days supply | Qty: 30 | Fill #0

## 2017-08-20 DIAGNOSIS — Z0000 Encounter for general adult medical examination without abnormal findings: Secondary | ICD-10-CM | POA: Diagnosis not present

## 2017-08-20 DIAGNOSIS — E039 Hypothyroidism, unspecified: Secondary | ICD-10-CM | POA: Diagnosis not present

## 2017-08-20 DIAGNOSIS — N938 Other specified abnormal uterine and vaginal bleeding: Secondary | ICD-10-CM | POA: Diagnosis not present

## 2017-08-20 DIAGNOSIS — F419 Anxiety disorder, unspecified: Secondary | ICD-10-CM | POA: Diagnosis not present

## 2017-08-20 DIAGNOSIS — G43009 Migraine without aura, not intractable, without status migrainosus: Secondary | ICD-10-CM | POA: Diagnosis not present

## 2017-08-20 MED FILL — LORazepam 0.5 MG TABS: 0.5 | 30 days supply | Qty: 30 | Fill #0

## 2017-08-20 MED FILL — VENLAFAXINE HCL ER 75 MG CA: 75 | 30 days supply | Qty: 90 | Fill #0

## 2017-08-20 MED FILL — NARATRIPTAN HCL 2.5 MG TAB: 2.5 | 20 days supply | Qty: 12 | Fill #0

## 2017-08-21 DIAGNOSIS — F411 Generalized anxiety disorder: Secondary | ICD-10-CM | POA: Diagnosis not present

## 2017-09-10 MED FILL — LEVOTHYROXINE 88 MCG TABLET: 88 | 30 days supply | Qty: 30 | Fill #1

## 2017-09-17 MED FILL — EMGALITY 120 MG/ML SOAJ: 120 | 30 days supply | Qty: 1 | Fill #0

## 2017-09-17 MED FILL — VENLAFAXINE HCL ER 75 MG CA: 75 | 30 days supply | Qty: 90 | Fill #1

## 2017-09-26 DIAGNOSIS — F411 Generalized anxiety disorder: Secondary | ICD-10-CM | POA: Diagnosis not present

## 2017-10-02 DIAGNOSIS — E039 Hypothyroidism, unspecified: Secondary | ICD-10-CM | POA: Diagnosis not present

## 2017-10-15 MED FILL — VENLAFAXINE HCL ER 75 MG CA: 75 | 30 days supply | Qty: 90 | Fill #2

## 2017-10-15 MED FILL — LORazepam 0.5 MG TABS: 0.5 | 30 days supply | Qty: 30 | Fill #0

## 2017-10-15 MED FILL — EMGALITY 120 MG/ML SOAJ: 120 | 30 days supply | Qty: 1 | Fill #1

## 2017-10-15 MED FILL — NARATRIPTAN HCL 2.5 MG TAB: 2.5 | 20 days supply | Qty: 12 | Fill #1

## 2017-10-15 MED FILL — LEVOTHYROXINE 88 MCG TABLET: 88 | 30 days supply | Qty: 30 | Fill #2

## 2017-10-16 DIAGNOSIS — F411 Generalized anxiety disorder: Secondary | ICD-10-CM | POA: Diagnosis not present

## 2017-10-30 DIAGNOSIS — F411 Generalized anxiety disorder: Secondary | ICD-10-CM | POA: Diagnosis not present

## 2017-11-01 NOTE — Telephone Encounter (Signed)
error 

## 2017-11-13 MED FILL — VENLAFAXINE HCL ER 75 MG CA: 75 | 30 days supply | Qty: 90 | Fill #3

## 2017-11-13 MED FILL — LEVOTHYROXINE 88 MCG TABLET: 88 | 30 days supply | Qty: 30 | Fill #3

## 2017-11-13 MED FILL — NARATRIPTAN HCL 2.5 MG TAB: 2.5 | 20 days supply | Qty: 12 | Fill #2

## 2017-11-13 MED FILL — EMGALITY 120 MG/ML SOAJ: 120 | 30 days supply | Qty: 1 | Fill #2

## 2017-11-14 DIAGNOSIS — F411 Generalized anxiety disorder: Secondary | ICD-10-CM | POA: Diagnosis not present

## 2017-11-26 DIAGNOSIS — Z23 Encounter for immunization: Secondary | ICD-10-CM | POA: Diagnosis not present

## 2017-11-26 DIAGNOSIS — F411 Generalized anxiety disorder: Secondary | ICD-10-CM | POA: Diagnosis not present

## 2017-11-26 DIAGNOSIS — J029 Acute pharyngitis, unspecified: Secondary | ICD-10-CM | POA: Diagnosis not present

## 2017-11-26 DIAGNOSIS — G43909 Migraine, unspecified, not intractable, without status migrainosus: Secondary | ICD-10-CM | POA: Diagnosis not present

## 2017-11-26 MED FILL — ESCITALOPRAM 10 MG TABLET: 10 | 30 days supply | Qty: 30 | Fill #0

## 2017-11-26 MED FILL — LORazepam 0.5 MG TABS: 0.5 | 30 days supply | Qty: 30 | Fill #0

## 2017-11-28 DIAGNOSIS — F411 Generalized anxiety disorder: Secondary | ICD-10-CM | POA: Diagnosis not present

## 2017-11-29 DIAGNOSIS — K116 Mucocele of salivary gland: Secondary | ICD-10-CM | POA: Diagnosis not present

## 2017-11-29 MED FILL — CEFUROXIME AXETIL 250 MG TA: 250 | 10 days supply | Qty: 20 | Fill #0

## 2017-12-06 DIAGNOSIS — E039 Hypothyroidism, unspecified: Secondary | ICD-10-CM | POA: Diagnosis not present

## 2017-12-17 MED FILL — LEVOTHYROXINE 88 MCG TABLET: 88 | 30 days supply | Qty: 30 | Fill #4

## 2017-12-17 MED FILL — NARATRIPTAN HCL 2.5 MG TAB: 2.5 | 20 days supply | Qty: 12 | Fill #3

## 2017-12-17 MED FILL — VENLAFAXINE HCL ER 37.5 MG: 37.5 | 7 days supply | Qty: 7 | Fill #0

## 2017-12-17 MED FILL — ESCITALOPRAM 20 MG TABLET: 20 | 30 days supply | Qty: 30 | Fill #0

## 2017-12-18 MED FILL — EMGALITY 120 MG/ML SOAJ: 120 | 30 days supply | Qty: 1 | Fill #3

## 2017-12-26 DIAGNOSIS — F411 Generalized anxiety disorder: Secondary | ICD-10-CM | POA: Diagnosis not present

## 2018-01-09 DIAGNOSIS — F411 Generalized anxiety disorder: Secondary | ICD-10-CM | POA: Diagnosis not present

## 2018-01-11 MED FILL — ESCITALOPRAM 20 MG TABLET: 20 | 30 days supply | Qty: 30 | Fill #1

## 2018-01-11 MED FILL — NARATRIPTAN HCL 2.5 MG TAB: 2.5 | 20 days supply | Qty: 12 | Fill #4

## 2018-01-11 MED FILL — EMGALITY 120 MG/ML SOAJ: 120 | 30 days supply | Qty: 1 | Fill #4

## 2018-01-11 MED FILL — LEVOTHYROXINE 88 MCG TABLET: 88 | 30 days supply | Qty: 30 | Fill #5

## 2018-01-23 DIAGNOSIS — F411 Generalized anxiety disorder: Secondary | ICD-10-CM | POA: Diagnosis not present

## 2018-02-11 MED FILL — NARATRIPTAN HCL 2.5 MG TAB: 2.5 | 20 days supply | Qty: 12 | Fill #5

## 2018-02-11 MED FILL — EMGALITY 120 MG/ML SOAJ: 120 | 30 days supply | Qty: 1 | Fill #5

## 2018-02-11 MED FILL — LEVOTHYROXINE 88 MCG TABLET: 88 | 30 days supply | Qty: 30 | Fill #6

## 2018-02-12 MED FILL — ESCITALOPRAM 20 MG TABLET: 20 | 30 days supply | Qty: 30 | Fill #0

## 2018-02-13 DIAGNOSIS — G43009 Migraine without aura, not intractable, without status migrainosus: Secondary | ICD-10-CM | POA: Diagnosis not present

## 2018-02-13 DIAGNOSIS — F419 Anxiety disorder, unspecified: Secondary | ICD-10-CM | POA: Diagnosis not present

## 2018-02-13 MED FILL — NORETHIND-ETH ESTRAD 1-0.02: 1-20 | 84 days supply | Qty: 84 | Fill #0

## 2018-03-06 DIAGNOSIS — F411 Generalized anxiety disorder: Secondary | ICD-10-CM | POA: Diagnosis not present

## 2018-03-15 MED FILL — EMGALITY 120 MG/ML SOAJ: 120 | 90 days supply | Qty: 3 | Fill #0

## 2018-03-15 MED FILL — NARATRIPTAN HCL 2.5 MG TAB: 2.5 | 20 days supply | Qty: 12 | Fill #6

## 2018-03-15 MED FILL — ESCITALOPRAM 20 MG TABLET: 20 | 90 days supply | Qty: 90 | Fill #0

## 2018-03-18 MED FILL — LEVOTHYROXINE 88 MCG TABLET: 88 | 90 days supply | Qty: 90 | Fill #0

## 2018-04-03 DIAGNOSIS — F411 Generalized anxiety disorder: Secondary | ICD-10-CM | POA: Diagnosis not present

## 2018-04-10 DIAGNOSIS — H5213 Myopia, bilateral: Secondary | ICD-10-CM | POA: Diagnosis not present

## 2018-04-10 DIAGNOSIS — H52223 Regular astigmatism, bilateral: Secondary | ICD-10-CM | POA: Diagnosis not present

## 2018-04-10 MED FILL — LORazepam 0.5 MG TABS: 0.5 | 30 days supply | Qty: 30 | Fill #0

## 2018-05-09 MED FILL — NARATRIPTAN HCL 2.5 MG TAB: 2.5 | 20 days supply | Qty: 12 | Fill #7

## 2018-05-09 MED FILL — NORETHIND-ETH ESTRAD 1-0.02: 1-20 | 84 days supply | Qty: 84 | Fill #1

## 2018-05-22 DIAGNOSIS — G43709 Chronic migraine without aura, not intractable, without status migrainosus: Secondary | ICD-10-CM | POA: Diagnosis not present

## 2018-05-22 DIAGNOSIS — F419 Anxiety disorder, unspecified: Secondary | ICD-10-CM | POA: Diagnosis not present

## 2018-05-22 MED FILL — PROPRANOLOL 10 MG TABLET: 10 | 90 days supply | Qty: 90 | Fill #0

## 2018-06-03 MED FILL — LEVOTHYROXINE 88 MCG TABLET: 88 | 90 days supply | Qty: 90 | Fill #1

## 2018-06-03 MED FILL — ESCITALOPRAM 20 MG TABLET: 20 | 90 days supply | Qty: 90 | Fill #1

## 2018-06-12 DIAGNOSIS — F411 Generalized anxiety disorder: Secondary | ICD-10-CM | POA: Diagnosis not present

## 2018-06-15 MED FILL — UBRELVY 100 MG TABS: 100 | 30 days supply | Qty: 16 | Fill #0

## 2018-06-26 DIAGNOSIS — F411 Generalized anxiety disorder: Secondary | ICD-10-CM | POA: Diagnosis not present

## 2018-06-28 MED FILL — NARATRIPTAN HCL 2.5 MG TAB: 2.5 | 20 days supply | Qty: 12 | Fill #8

## 2018-06-28 MED FILL — EMGALITY 120 MG/ML SOAJ: 120 | 90 days supply | Qty: 3 | Fill #1

## 2018-06-29 MED FILL — LORazepam 0.5 MG TABS: 0.5 | 30 days supply | Qty: 30 | Fill #0

## 2018-07-11 DIAGNOSIS — F411 Generalized anxiety disorder: Secondary | ICD-10-CM | POA: Diagnosis not present

## 2018-08-14 DIAGNOSIS — F411 Generalized anxiety disorder: Secondary | ICD-10-CM | POA: Diagnosis not present

## 2018-08-15 DIAGNOSIS — E039 Hypothyroidism, unspecified: Secondary | ICD-10-CM | POA: Diagnosis not present

## 2018-08-20 DIAGNOSIS — G43709 Chronic migraine without aura, not intractable, without status migrainosus: Secondary | ICD-10-CM | POA: Diagnosis not present

## 2018-08-20 DIAGNOSIS — G4700 Insomnia, unspecified: Secondary | ICD-10-CM | POA: Diagnosis not present

## 2018-08-20 MED FILL — NORTRIPTYLINE HCL 10 MG CAP: 10 | 30 days supply | Qty: 30 | Fill #0

## 2018-08-20 MED FILL — NARATRIPTAN HCL 2.5 MG TAB: 2.5 | 30 days supply | Qty: 12 | Fill #0

## 2018-08-20 MED FILL — UBRELVY 100 MG TABS: 100 | 30 days supply | Qty: 10 | Fill #0

## 2018-08-21 DIAGNOSIS — E039 Hypothyroidism, unspecified: Secondary | ICD-10-CM | POA: Diagnosis not present

## 2018-09-03 MED FILL — LEVOTHYROXINE 88 MCG TABLET: 88 | 90 days supply | Qty: 90 | Fill #0

## 2018-09-03 MED FILL — NORTRIPTYLINE HCL 10 MG CAP: 10 | 30 days supply | Qty: 90 | Fill #0

## 2018-09-03 MED FILL — NORETHIND-ETH ESTRAD 1-0.02: 1-20 | 84 days supply | Qty: 84 | Fill #2

## 2018-09-04 DIAGNOSIS — F411 Generalized anxiety disorder: Secondary | ICD-10-CM | POA: Diagnosis not present

## 2018-09-04 MED FILL — ESCITALOPRAM 20 MG TABLET: 20 | 90 days supply | Qty: 90 | Fill #0

## 2018-10-01 DIAGNOSIS — Z03818 Encounter for observation for suspected exposure to other biological agents ruled out: Secondary | ICD-10-CM | POA: Diagnosis not present

## 2018-10-02 DIAGNOSIS — F411 Generalized anxiety disorder: Secondary | ICD-10-CM | POA: Diagnosis not present

## 2018-10-02 MED FILL — NARATRIPTAN HCL 2.5 MG TAB: 2.5 | 30 days supply | Qty: 12 | Fill #1

## 2018-10-02 MED FILL — UBRELVY 100 MG TABS: 100 | 30 days supply | Qty: 10 | Fill #1

## 2018-10-02 MED FILL — NORTRIPTYLINE HCL 10 MG CAP: 10 | 30 days supply | Qty: 90 | Fill #1

## 2018-10-18 MED FILL — EMGALITY 120 MG/ML SOAJ: 120 | 90 days supply | Qty: 3 | Fill #0

## 2018-10-30 DIAGNOSIS — F411 Generalized anxiety disorder: Secondary | ICD-10-CM | POA: Diagnosis not present

## 2018-11-04 MED FILL — UBRELVY 100 MG TABS: 100 | 30 days supply | Qty: 10 | Fill #2

## 2018-11-04 MED FILL — NARATRIPTAN HCL 2.5 MG TAB: 2.5 | 30 days supply | Qty: 12 | Fill #2

## 2018-11-04 MED FILL — NORTRIPTYLINE HCL 10 MG CAP: 10 | 30 days supply | Qty: 90 | Fill #2

## 2018-11-13 DIAGNOSIS — F411 Generalized anxiety disorder: Secondary | ICD-10-CM | POA: Diagnosis not present

## 2018-11-19 ENCOUNTER — Encounter: Payer: Self-pay | Admitting: Physician Assistant

## 2018-11-19 ENCOUNTER — Telehealth: Payer: 59 | Admitting: Physician Assistant

## 2018-11-19 ENCOUNTER — Other Ambulatory Visit: Payer: Self-pay

## 2018-11-19 DIAGNOSIS — R6889 Other general symptoms and signs: Secondary | ICD-10-CM | POA: Diagnosis not present

## 2018-11-19 DIAGNOSIS — Z20822 Contact with and (suspected) exposure to covid-19: Secondary | ICD-10-CM

## 2018-11-19 MED ORDER — FLUTICASONE PROPIONATE 50 MCG/ACT NA SUSP: 2.0000 | Freq: Every day | NASAL | 0 refills | Status: DC

## 2018-11-19 MED ORDER — BENZONATATE 100 MG PO CAPS: 100.0000 mg | ORAL_CAPSULE | Freq: Three times a day (TID) | ORAL | 0 refills | Status: DC | PRN

## 2018-11-19 MED FILL — FLUTICASONE PROP 50 MCG SPR: 50 | 30 days supply | Qty: 16 | Fill #0

## 2018-11-19 MED FILL — BENZONATATE 100 MG CAPS: 100 | 7 days supply | Qty: 20 | Fill #0

## 2018-11-19 NOTE — Progress Notes (Addendum)
E-Visit for Corona Virus Screening   Your current symptoms could be consistent with the coronavirus.  Many health care providers can now test patients at their office but not all are.  [facility] has multiple testing sites. For information on our COVID testing locations and hours go to [email or web address]  Please quarantine yourself while awaiting your test results.  We are enrolling you in our MyChart Home Montioring for COVID19 . Daily you will receive a questionnaire within the MyChart website. Our COVID 19 response team willl be monitoriing your responses daily.    COVID-19 is a respiratory illness with symptoms that are similar to the flu. Symptoms are typically mild to moderate, but there have been cases of severe illness and death due to the virus. The following symptoms may appear 2-14 days after exposure: . Fever . Cough . Shortness of breath or difficulty breathing . Chills . Repeated shaking with chills . Muscle pain . Headache . Sore throat . New loss of taste or smell . Fatigue . Congestion or runny nose . Nausea or vomiting . Diarrhea  It is vitally important that if you feel that you have an infection such as this virus or any other virus that you stay home and away from places where you may spread it to others.  You should self-quarantine for 14 days if you have symptoms that could potentially be coronavirus or have been in close contact a with a person diagnosed with COVID-19 within the last 2 weeks. You should avoid contact with people age 36 and older.   You should wear a mask or cloth face covering over your nose and mouth if you must be around other people or animals, including pets (even at home). Try to stay at least 6 feet away from other people. This will protect the people around you.  You can use medication such as A prescription cough medication called Tessalon Perles 100 mg. You may take 1-2 capsules every 8 hours as needed for cough  You may also use Fluticasone nasal spray, 2 sprays in each nostril daily for congestion/stuffy nose.   You may also take acetaminophen (Tylenol) as needed for fever.   Reduce your risk of any infection by using the same precautions used for avoiding the common cold or flu:  Marland Kitchen. Wash your hands often with soap and warm water for at least 20 seconds.  If soap and water are not readily available, use an alcohol-based hand sanitizer with at least 60% alcohol.  . If coughing or sneezing, cover your mouth and nose by coughing or sneezing into the elbow areas of your shirt or coat, into a tissue or into your sleeve (not your hands). . Avoid shaking hands with others and consider head nods or verbal greetings only. . Avoid touching your eyes, nose, or mouth with unwashed hands.  . Avoid close contact with people who are sick. . Avoid places or events with large numbers of people in one location, like concerts or sporting events. . Carefully consider travel plans you have or are making. . If you are planning any travel outside or inside the [place], visit the CDC's Travelers' Health webpage for the latest health notices. . If you have some symptoms but not all symptoms, continue to monitor at home and seek medical attention if your symptoms worsen. . If you are having a medical emergency, call 911.  HOME CARE . Only take medications as instructed by your medical team. . Drink plenty of fluids and get  plenty of rest. . A steam or ultrasonic humidifier can help if you have congestion.   GET HELP RIGHT AWAY IF YOU HAVE EMERGENCY WARNING SIGNS** FOR COVID-19. If you or someone is showing any of these signs seek emergency medical care immediately. Call 911 or proceed to your closest emergency facility if: . You develop worsening high fever. . Trouble breathing . Bluish lips or face . Persistent pain or pressure in the chest . New confusion . Inability to wake or stay awake . You cough up blood. . Your  symptoms become more severe  **This list is not all possible symptoms. Contact your medical provider for any symptoms that are sever or concerning to you.   MAKE SURE YOU   Understand these instructions.  Will watch your condition.  Will get help right away if you are not doing well or get worse.  Your e-visit answers were reviewed by a board certified advanced clinical practitioner to complete your personal care plan.  Depending on the condition, your plan could have included both over the counter or prescription medications.  If there is a problem please reply once you have received a response from your provider.  Your safety is important to [place].  If you have drug allergies check your prescription carefully.    You can use MyChart to ask questions about today's visit, request a non-urgent call back, or ask for a work or school excuse for 24 hours related to this e-Visit. If it has been greater than 24 hours you will need to follow up with your provider, or enter a new e-Visit to address those concerns. You will get an e-mail in the next two days asking about your experience.  I hope that your e-visit has been valuable and will speed your recovery. Thank you for using e-visits.  Greater than 5 minutes, yet less than 10 minutes of time have been spent researching, coordinating, and implementing care for this patient today.

## 2018-11-20 LAB — NOVEL CORONAVIRUS, NAA: SARS-CoV-2, NAA: NOT DETECTED

## 2018-11-27 DIAGNOSIS — F900 Attention-deficit hyperactivity disorder, predominantly inattentive type: Secondary | ICD-10-CM | POA: Diagnosis not present

## 2018-11-27 DIAGNOSIS — G43909 Migraine, unspecified, not intractable, without status migrainosus: Secondary | ICD-10-CM | POA: Diagnosis not present

## 2018-11-27 DIAGNOSIS — F419 Anxiety disorder, unspecified: Secondary | ICD-10-CM | POA: Diagnosis not present

## 2018-11-28 MED FILL — DEXMETHYLPHENIDATE ER 10 MG: 10 | 30 days supply | Qty: 30 | Fill #0

## 2018-11-28 MED FILL — LORazepam 0.5 MG TABS: 0.5 | 10 days supply | Qty: 30 | Fill #0

## 2018-11-28 MED FILL — ESCITALOPRAM 10 MG TABLET: 10 | 90 days supply | Qty: 90 | Fill #0

## 2018-11-28 MED FILL — LEVOTHYROXINE 88 MCG TABLET: 88 | 90 days supply | Qty: 90 | Fill #1

## 2018-11-28 MED FILL — NORETHIND-ETH ESTRAD 1-0.02: 1-20 | 84 days supply | Qty: 84 | Fill #0

## 2018-11-28 MED FILL — UBRELVY 100 MG TABS: 100 | 30 days supply | Qty: 10 | Fill #2

## 2018-12-02 MED FILL — NORTRIPTYLINE HCL 10 MG CAP: 10 | 30 days supply | Qty: 90 | Fill #0

## 2018-12-04 DIAGNOSIS — G43709 Chronic migraine without aura, not intractable, without status migrainosus: Secondary | ICD-10-CM | POA: Diagnosis not present

## 2018-12-04 MED FILL — NARATRIPTAN HCL 2.5 MG TAB: 2.5 | 30 days supply | Qty: 18 | Fill #0

## 2018-12-11 DIAGNOSIS — F411 Generalized anxiety disorder: Secondary | ICD-10-CM | POA: Diagnosis not present

## 2018-12-25 DIAGNOSIS — F411 Generalized anxiety disorder: Secondary | ICD-10-CM | POA: Diagnosis not present

## 2019-01-01 MED FILL — DEXMETHYLPHENIDATE HCL ER 1: 10 | 30 days supply | Qty: 30 | Fill #0

## 2019-01-02 MED FILL — NORTRIPTYLINE HCL 10 MG CAP: 10 | 30 days supply | Qty: 90 | Fill #1

## 2019-01-08 DIAGNOSIS — F411 Generalized anxiety disorder: Secondary | ICD-10-CM | POA: Diagnosis not present

## 2019-01-09 MED FILL — EMGALITY 120 MG/ML SOAJ: 120 | 90 days supply | Qty: 3 | Fill #1

## 2019-01-16 DIAGNOSIS — F411 Generalized anxiety disorder: Secondary | ICD-10-CM | POA: Diagnosis not present

## 2019-01-16 DIAGNOSIS — F900 Attention-deficit hyperactivity disorder, predominantly inattentive type: Secondary | ICD-10-CM | POA: Diagnosis not present

## 2019-01-20 DIAGNOSIS — F411 Generalized anxiety disorder: Secondary | ICD-10-CM | POA: Diagnosis not present

## 2019-01-27 MED FILL — NARATRIPTAN HCL 2.5 MG TAB: 2.5 | 30 days supply | Qty: 18 | Fill #1

## 2019-01-27 MED FILL — LORazepam 0.5 MG TABS: 0.5 | 30 days supply | Qty: 30 | Fill #0

## 2019-01-27 MED FILL — DEXMETHYLPHENIDATE HCL ER 2: 20 | 30 days supply | Qty: 30 | Fill #0

## 2019-02-03 MED FILL — NORTRIPTYLINE HCL 10 MG CAP: 10 | 90 days supply | Qty: 270 | Fill #0

## 2019-02-05 DIAGNOSIS — F411 Generalized anxiety disorder: Secondary | ICD-10-CM | POA: Diagnosis not present

## 2019-02-17 MED FILL — NORETHIND-ETH ESTRAD 1-0.02: 1-20 | 84 days supply | Qty: 84 | Fill #1

## 2019-02-17 MED FILL — UBRELVY 100 MG TABS: 100 | 30 days supply | Qty: 10 | Fill #3

## 2019-02-17 MED FILL — LEVOTHYROXINE 88 MCG TABLET: 88 | 90 days supply | Qty: 90 | Fill #2

## 2019-02-19 DIAGNOSIS — F411 Generalized anxiety disorder: Secondary | ICD-10-CM | POA: Diagnosis not present

## 2019-03-05 DIAGNOSIS — F411 Generalized anxiety disorder: Secondary | ICD-10-CM | POA: Diagnosis not present

## 2019-03-11 MED FILL — ESCITALOPRAM 10 MG TABLET: 10 | 90 days supply | Qty: 90 | Fill #0

## 2019-04-02 DIAGNOSIS — F411 Generalized anxiety disorder: Secondary | ICD-10-CM | POA: Diagnosis not present

## 2019-04-03 DIAGNOSIS — F419 Anxiety disorder, unspecified: Secondary | ICD-10-CM | POA: Diagnosis not present

## 2019-04-03 DIAGNOSIS — F324 Major depressive disorder, single episode, in partial remission: Secondary | ICD-10-CM | POA: Diagnosis not present

## 2019-04-03 DIAGNOSIS — F900 Attention-deficit hyperactivity disorder, predominantly inattentive type: Secondary | ICD-10-CM | POA: Diagnosis not present

## 2019-04-03 MED FILL — buPROPion HCL ER (XL) 150 M: 150 | 90 days supply | Qty: 90 | Fill #0

## 2019-04-04 MED FILL — UBRELVY 100 MG TABS: 100 | 30 days supply | Qty: 10 | Fill #4

## 2019-04-22 DIAGNOSIS — F411 Generalized anxiety disorder: Secondary | ICD-10-CM | POA: Diagnosis not present

## 2019-04-22 MED FILL — EMGALITY 120 MG/ML SOAJ: 120 | 30 days supply | Qty: 1 | Fill #0

## 2019-05-14 DIAGNOSIS — F411 Generalized anxiety disorder: Secondary | ICD-10-CM | POA: Diagnosis not present

## 2019-05-16 MED FILL — EMGALITY 120 MG/ML SOAJ: 120 | 30 days supply | Qty: 1 | Fill #1

## 2019-05-16 MED FILL — LORazepam 0.5 MG TABS: 0.5 | 30 days supply | Qty: 30 | Fill #0

## 2019-05-16 MED FILL — UBRELVY 100 MG TABS: 100 | 30 days supply | Qty: 10 | Fill #5

## 2019-05-28 DIAGNOSIS — F411 Generalized anxiety disorder: Secondary | ICD-10-CM | POA: Diagnosis not present

## 2019-06-02 MED FILL — LEVOTHYROXINE 88 MCG TABLET: 88 | 90 days supply | Qty: 90 | Fill #3

## 2019-06-02 MED FILL — ESCITALOPRAM 10 MG TABLET: 10 | 90 days supply | Qty: 90 | Fill #0

## 2019-06-02 MED FILL — NORETHIND-ETH ESTRAD 1-0.02: 1-20 | 84 days supply | Qty: 84 | Fill #2

## 2019-06-09 DIAGNOSIS — G43709 Chronic migraine without aura, not intractable, without status migrainosus: Secondary | ICD-10-CM | POA: Diagnosis not present

## 2019-06-09 MED FILL — UBRELVY 100 MG TABS: 100 | 30 days supply | Qty: 30 | Fill #0

## 2019-06-09 MED FILL — EMGALITY 120 MG/ML SOAJ: 120 | 30 days supply | Qty: 1 | Fill #0

## 2019-06-09 MED FILL — ONDANSETRON ODT 8 MG TABLET: 8 | 6 days supply | Qty: 20 | Fill #0

## 2019-06-09 MED FILL — NARATRIPTAN HCL 2.5 MG TAB: 2.5 | 60 days supply | Qty: 36 | Fill #0

## 2019-06-11 DIAGNOSIS — F411 Generalized anxiety disorder: Secondary | ICD-10-CM | POA: Diagnosis not present

## 2019-06-25 DIAGNOSIS — F411 Generalized anxiety disorder: Secondary | ICD-10-CM | POA: Diagnosis not present

## 2019-07-08 MED FILL — buPROPion HCL ER (XL) 150 M: 150 | 90 days supply | Qty: 90 | Fill #0

## 2019-07-09 DIAGNOSIS — F411 Generalized anxiety disorder: Secondary | ICD-10-CM | POA: Diagnosis not present

## 2019-07-10 ENCOUNTER — Encounter: Payer: Self-pay | Admitting: Neurology

## 2019-07-10 ENCOUNTER — Telehealth: Payer: Self-pay | Admitting: *Deleted

## 2019-07-10 ENCOUNTER — Ambulatory Visit (INDEPENDENT_AMBULATORY_CARE_PROVIDER_SITE_OTHER): Payer: 59 | Admitting: Neurology

## 2019-07-10 ENCOUNTER — Other Ambulatory Visit: Payer: Self-pay

## 2019-07-10 VITALS — BP 113/72 | HR 101 | Temp 97.3°F | Ht 64.0 in | Wt 132.0 lb

## 2019-07-10 DIAGNOSIS — G43711 Chronic migraine without aura, intractable, with status migrainosus: Secondary | ICD-10-CM

## 2019-07-10 DIAGNOSIS — G43831 Menstrual migraine, intractable, with status migrainosus: Secondary | ICD-10-CM | POA: Diagnosis not present

## 2019-07-10 MED ORDER — NURTEC 75 MG PO TBDP: 75.0000 mg | ORAL_TABLET | Freq: Every day | ORAL | 0 refills | Status: DC | PRN

## 2019-07-10 MED ORDER — FROVATRIPTAN SUCCINATE 2.5 MG PO TABS: 2.5000 mg | ORAL_TABLET | ORAL | 3 refills | Status: DC | PRN

## 2019-07-10 NOTE — Patient Instructions (Addendum)
Start Botox Continue Emgality Try nurtec acutely - 11 hour half life Frovatriptan: 26 hour half life but not as quick in onset as amerge  Frovatriptan tablets What is this medicine? FROVATRIPTAN (froe va TRIP tan) is used to treat migraines with or without aura. An aura is a strange feeling or visual disturbance that warns you of an attack. It is not used to prevent migraines. This medicine may be used for other purposes; ask your health care provider or pharmacist if you have questions. COMMON BRAND NAME(S): Frova What should I tell my health care provider before I take this medicine? They need to know if you have any of these conditions:  cigarette smoker  circulation problems in fingers and toes  diabetes  heart disease  high blood pressure  high cholesterol  history of irregular heartbeat  history of stroke  kidney disease  liver disease  stomach or intestine problems  an unusual or allergic reaction to frovatriptan, other medicines, foods, dyes, or preservatives  pregnant or trying to get pregnant  breast-feeding How should I use this medicine? Take this medicine by mouth with a glass of water. Follow the directions on the prescription label. Do not take it more often than directed. Talk to your pediatrician regarding the use of this medicine in children. Special care may be needed. Overdosage: If you think you have taken too much of this medicine contact a poison control center or emergency room at once. NOTE: This medicine is only for you. Do not share this medicine with others. What if I miss a dose? This does not apply. This medicine is not for regular use. What may interact with this medicine? Do not take this medicine with any of the following medicines:  certain medicines for migraine headache like almotriptan, eletriptan, frovatriptan, naratriptan, rizatriptan, sumatriptan, zolmitriptan  ergot alkaloids like dihydroergotamine, ergonovine, ergotamine,  methylergonovine This medicine may also interact with the following medications:  certain medicines for depression, anxiety, or psychotic disorders  MAOIs like Carbex, Eldepryl, Marplan, Nardil, and Parnate This list may not describe all possible interactions. Give your health care provider a list of all the medicines, herbs, non-prescription drugs, or dietary supplements you use. Also tell them if you smoke, drink alcohol, or use illegal drugs. Some items may interact with your medicine. What should I watch for while using this medicine? Visit your healthcare professional for regular checks on your progress. Tell your healthcare professional if your symptoms do not start to get better or if they get worse. You may get drowsy or dizzy. Do not drive, use machinery, or do anything that needs mental alertness until you know how this medicine affects you. Do not stand up or sit up quickly, especially if you are an older patient. This reduces the risk of dizzy or fainting spells. Alcohol may interfere with the effect of this medicine. Your mouth may get dry. Chewing sugarless gum or sucking hard candy and drinking plenty of water may help. Contact your healthcare professional if the problem does not go away or is severe. Tell your healthcare professional right away if you have any change in your eyesight. If you take migraine medicines for 10 or more days a month, your migraines may get worse. Keep a diary of headache days and medicine use. Contact your healthcare professional if your migraine attacks occur more frequently. What side effects may I notice from receiving this medicine? Side effects that you should report to your doctor or health care professional as soon as  possible:  allergic reactions like skin rash, itching or hives, swelling of the face, lips, or tongue  changes in vision  chest pain or chest tightness  signs and symptoms of a dangerous change in heartbeat or heart rhythm like  chest pain; dizziness; fast, irregular heartbeat; palpitations; feeling faint or lightheaded; falls; breathing problems  signs and symptoms of a stroke like changes in vision; confusion; trouble speaking or understanding; severe headaches; sudden numbness or weakness of the face, arm or leg; trouble walking; dizziness; loss of balance or coordination  signs and symptoms of serotonin syndrome like irritable; confusion; diarrhea; fast or irregular heartbeat; muscle twitching; stiff muscles; trouble walking; sweating; high fever; seizures; chills; vomiting Side effects that usually do not require medical attention (report to your doctor or health care professional if they continue or are bothersome):  diarrhea  dizziness  dry mouth  headache  nausea, vomiting  pain, tingling, numbness in the hands or feet  stomach pain This list may not describe all possible side effects. Call your doctor for medical advice about side effects. You may report side effects to FDA at 1-800-FDA-1088. Where should I keep my medicine? Keep out of the reach of children. Store at room temperature between 20 and 25 degrees C (68 and 77 degrees F). Protect from light and moisture. Throw away any unused medicine after the expiration date. NOTE: This sheet is a summary. It may not cover all possible information. If you have questions about this medicine, talk to your doctor, pharmacist, or health care provider.  2020 Elsevier/Gold Standard (2017-09-18 14:52:36) Rimegepant oral dissolving tablet What is this medicine? RIMEGEPANT (ri ME je pant) is used to treat migraine headaches with or without aura. An aura is a strange feeling or visual disturbance that warns you of an attack. It is not used to prevent migraines. This medicine may be used for other purposes; ask your health care provider or pharmacist if you have questions. COMMON BRAND NAME(S): NURTEC ODT What should I tell my health care provider before I take  this medicine? They need to know if you have any of these conditions:  kidney disease  liver disease  an unusual or allergic reaction to rimegepant, other medicines, foods, dyes, or preservatives  pregnant or trying to get pregnant  breast-feeding How should I use this medicine? Take the medicine by mouth. Follow the directions on the prescription label. Leave the tablet in the sealed blister pack until you are ready to take it. With dry hands, open the blister and gently remove the tablet. If the tablet breaks or crumbles, throw it away and take a new tablet out of the blister pack. Place the tablet in the mouth and allow it to dissolve, and then swallow. Do not cut, crush, or chew this medicine. You do not need water to take this medicine. Talk to your pediatrician about the use of this medicine in children. Special care may be needed. Overdosage: If you think you have taken too much of this medicine contact a poison control center or emergency room at once. NOTE: This medicine is only for you. Do not share this medicine with others. What if I miss a dose? This does not apply. This medicine is not for regular use. What may interact with this medicine? This medicine may interact with the following medications:  certain medicines for fungal infections like fluconazole, itraconazole  rifampin This list may not describe all possible interactions. Give your health care provider a list of all the  medicines, herbs, non-prescription drugs, or dietary supplements you use. Also tell them if you smoke, drink alcohol, or use illegal drugs. Some items may interact with your medicine. What should I watch for while using this medicine? Visit your health care professional for regular checks on your progress. Tell your health care professional if your symptoms do not start to get better or if they get worse. What side effects may I notice from receiving this medicine? Side effects that you should  report to your doctor or health care professional as soon as possible:  allergic reactions like skin rash, itching or hives; swelling of the face, lips, or tongue Side effects that usually do not require medical attention (report these to your doctor or health care professional if they continue or are bothersome):  nausea This list may not describe all possible side effects. Call your doctor for medical advice about side effects. You may report side effects to FDA at 1-800-FDA-1088. Where should I keep my medicine? Keep out of the reach of children. Store at room temperature between 15 and 30 degrees C (59 and 86 degrees F). Throw away any unused medicine after the expiration date. NOTE: This sheet is a summary. It may not cover all possible information. If you have questions about this medicine, talk to your doctor, pharmacist, or health care provider.  2020 Elsevier/Gold Standard (2018-05-20 00:21:31)

## 2019-07-10 NOTE — Telephone Encounter (Signed)
Patient has a Botox appointment on 07/14/2019.  I called [facility] and spoke to [place].  She states [phone] and [id number] are valid and do not require PA.  Ref# for this call is [id number] [id number]

## 2019-07-10 NOTE — Progress Notes (Addendum)
[facility]    Provider:  Dr Lucia GaskinsAhern Requesting Provider: Darrow Bussinghristine Hagan, transfer of care Primary Care Provider:  Laurann MontanaWhite, Cynthia, MD  CC:  migraines  Addendum 8/16:  Will discontinue ajovy*. Will continue botox and qulipta. Ubrelvy prn. Doesn't like nurtec as much*. Discussed elyxyb and Trudessa and Reyvow as well. she had a side effect to topiramate. Already tried multiple triptans (sumatriptan, maxalt, almotriptan, eletriptan, frova for menstrual migraine)   HPI:  Heidi Hickessa J Walker is a 39 y.o. female here as requested by Darrow Bussinghristine Hagan MD as a transfer of care for chronic migraine. PMHx hypothyroidism.  I reviewed Dr. Eulis ManlyHagan's notes, patient is currently on Emgality for preventative management, Zofran Amerge and Bernita RaisinUbrelvy for acute management.  They have also discussed Botox patient would be an excellent candidate for Botox.  She continues to have chronic migraines despite adequate trials of beta-blockers propranolol and antidepressants Effexor and nortriptyline.  Patient had migraines for over a year, pain is located in the right unilateral and frontal region and radiates to the face neck shoulder and upper back, aching dull sharp stabbing and squeezing, severe, associated dizziness and ear pain and eye pain and insomnia, nausea, neck pain, phonophobia, photophobia and sinus pressure.  Symptoms are aggravated by alcohol, bright light, caffeine withdrawal, stress, fatigue, hunger, menstrual cycle, no wheeze, weather changes, work.  She is try triptans and antidepressants for the symptoms.  The treatment provided mild relief.  Patient's had migraines since her college at least a decade, worsening in the last year.  She has 15 headache days a month and 12 migraine days a month.  Neurologic exam was normal.  Patient is here alone.  She describes at least 15 headache days a month and 15 migraine days a month for over a year.  She has had migraines since college worsening in the last  year.  She is tried multiple medications including Emgality appropriately and for long enough trials which she failed.  Pain is located in the right unilateral and frontal areas, pulsating pounding throbbing, radiates to the face neck shoulder and upper back, migraines are moderately severe to severe with associated dizziness and ear pain, phonophobia, photophobia, nausea and can last 24 to 72 hours.  No aura.  No medication overuse.  Significantly interfering with life.  Movement makes it worse. She has associated fatigue, body aches. She reports photophobia/phonophobia, she is on continuous birth control because migraines were so severe with menses. Pressure changes affect her, alcohol can be a trigger. She has multiple triggers. If she doesn't sleep well or stress as well as stress letdown. She can have vision changes, dizziness, vertigo, vomiting and the headaches can be in the morning when she wakes and can be worse positionally. Emgality has helped a lot but she is still having 15 headache days a month and at least 1-15 migraine days. None of the triptans worked, she loves [place]relvy. No aura. No medication overuse. She had an MRI in the past of the brain which was normal. No other focal neurologic deficits, associated symptoms, inciting events or modifiable factors.   Reviewed notes, labs and imaging from outside physicians, which showed:  Via chart review medications tried that can be used for migraine management, failed or not tolerated: Propranolol, Effexor, nortriptyline,topamax, bupropion, escitalopram, naratriptan, Emgality, Zofran.  Excedrin, Benadryl, Allegra, Sudafed, Phenergan, Aleve, Flexeril, Klonopin,tylenol, ibuprofen, oxycodone, maxalt. Addendum 8/16:  Will discontinue ajovy*. Will continue botox and qulipta. Ubrelvy prn. Doesn't like nurtec as much*. Discussed elyxyb and Trudessa and Reyvow  as well. she had a side effect to topiramate. Already tried multiple triptans (sumatriptan, maxalt,  almotriptan, eletriptan, frova for menstrual migraine)   Review of Systems: Patient complains of symptoms per HPI as well as the following symptoms: headache. Pertinent negatives and positives per HPI. All others negative.   Social History   Socioeconomic History   Marital status: Married    Spouse name: Not on file   Number of children: 2   Years of education: Not on file   Highest education level: Master's degree (e.g., MA, MS, MEng, MEd, MSW, MBA)  Occupational History   Not on file  Tobacco Use   Smoking status: Never Smoker   Smokeless tobacco: Never Used  Substance and Sexual Activity   Alcohol use: Yes    Alcohol/week: 1.0 - 2.0 standard drinks    Types: 1 - 2 Standard drinks or equivalent per week   Drug use: No   Sexual activity: Yes    Birth control/protection: Pill  Other Topics Concern   Not on file  Social History Narrative   Lives at home with husband and children   Right handed   Caffeine: 2 cups of tea/day   Social Determinants of Health   Financial Resource Strain:    Difficulty of Paying Living Expenses:   Food Insecurity:    Worried About Programme researcher, broadcasting/film/video in the Last Year:    Barista in the Last Year:   Transportation Needs:    Freight forwarder (Medical):    Lack of Transportation (Non-Medical):   Physical Activity:    Days of Exercise per Week:    Minutes of Exercise per Session:   Stress:    Feeling of Stress :   Social Connections:    Frequency of Communication with Friends and Family:    Frequency of Social Gatherings with Friends and Family:    Attends Religious Services:    Active Member of Clubs or Organizations:    Attends Engineer, structural:    Marital Status:   Intimate Partner Violence:    Fear of Current or Ex-Partner:    Emotionally Abused:    Physically Abused:    Sexually Abused:     Family History  Problem Relation Age of Onset   Diabetes Maternal Grandmother    Dementia Paternal  Grandfather    Thyroid disease Other        mother's side, many are on thyroid medicine    High blood pressure Other    Migraines Maternal Aunt    Other Neg Hx     Past Medical History:  Diagnosis Date   ADHD    Anxiety    Hypothyroidism    Migraines     Patient Active Problem List   Diagnosis Date Noted   Chronic migraine without aura, with intractable migraine, so stated, with status migrainosus 07/10/2019   Intractable menstrual migraine with status migrainosus 07/10/2019   Postpartum care following vaginal delivery (6/12) 08/30/2012   Normal labor and delivery 08/29/2012    Past Surgical History:  Procedure Laterality Date   WISDOM TOOTH EXTRACTION      Current Outpatient Medications  Medication Sig Dispense Refill   acetaminophen (TYLENOL) 325 MG tablet Take 650 mg by mouth every 6 (six) hours as needed for pain.     aspirin-acetaminophen-caffeine (EXCEDRIN MIGRAINE) 250-250-65 MG tablet Take 1 tablet by mouth every 6 (six) hours as needed.     buPROPion (WELLBUTRIN XL) 150 MG 24 hr tablet  Take 150 mg by mouth daily.     Calcium-Phosphorus-Vitamin D (CALCIUM/D3 ADULT GUMMIES PO) Take 2 each by mouth daily.     escitalopram (LEXAPRO) 10 MG tablet Take 10 mg by mouth daily.     fexofenadine (ALLEGRA) 60 MG tablet Take by mouth.     Galcanezumab-gnlm (EMGALITY) 120 MG/ML SOAJ Inject 1 pen into the skin every 30 (thirty) days.     levothyroxine (SYNTHROID) 88 MCG tablet Take 88 mcg by mouth daily.     Multiple Vitamin (MULTIVITAMIN PO) Take by mouth.     naratriptan (AMERGE) 2.5 MG tablet Take 2.5 mg by mouth as needed for migraine. Take one (1) tablet at onset of headache; if returns or does not resolve, may repeat after 4 hours; do not exceed five (5) mg in 24 hours.     norethindrone-ethinyl estradiol (LOESTRIN) 1-20 MG-MCG tablet Take by mouth.     ondansetron (ZOFRAN) 4 MG tablet      Probiotic Product (PROBIOTIC DAILY PO) Take 1 capsule by mouth daily.      Ubrogepant (UBRELVY) 100 MG TABS Take 100 mg by mouth as needed (may repeat in 2 hours. Max 2/24 hours.).     frovatriptan (FROVA) 2.5 MG tablet Take 1 tablet (2.5 mg total) by mouth as needed for migraine. If recurs, may repeat after 2 hours. Max of 3 tabs in 24 hours. 10 tablet 3   Rimegepant Sulfate (NURTEC) 75 MG TBDP Take 75 mg by mouth daily as needed. For migraines. Take as close to onset of migraine as possible. One daily maximum. 10 tablet 0   No current facility-administered medications for this visit.    Allergies as of 07/10/2019   (No Known Allergies)    Vitals: BP 113/72 (BP Location: Right Arm, Patient Position: Sitting)   Pulse (!) 101   Temp (!) 97.3 F (36.3 C) Comment: taken at front  Ht 5\' 4"  (1.626 m)   Wt 132 lb (59.9 kg)   BMI 22.66 kg/m  Last Weight:  Wt Readings from Last 1 Encounters:  07/10/19 132 lb (59.9 kg)   Last Height:   Ht Readings from Last 1 Encounters:  07/10/19 5\' 4"  (1.626 m)     Physical exam: Exam: Gen: NAD, conversant, well nourised, well groomed                     CV: RRR, no MRG. No Carotid Bruits. No peripheral edema, warm, nontender Eyes: Conjunctivae clear without exudates or hemorrhage  Neuro: Detailed Neurologic Exam  Speech:    Speech is normal; fluent and spontaneous with normal comprehension.  Cognition:    The patient is oriented to person, place, and time;     recent and remote memory intact;     language fluent;     normal attention, concentration,     fund of knowledge Cranial Nerves:    The pupils are equal, round, and reactive to light. The fundi are normal and spontaneous venous pulsations are present. Visual fields are full to finger confrontation. Extraocular movements are intact. Trigeminal sensation is intact and the muscles of mastication are normal. The face is symmetric. The palate elevates in the midline. Hearing intact. Voice is normal. Shoulder shrug is normal. The tongue has normal motion without  fasciculations.   Coordination:    Normal   Gait:  normal native gait  Motor Observation:    No asymmetry, no atrophy, and no involuntary movements noted. Tone:    Normal muscle  tone.    Posture:    Posture is normal. normal erect    Strength:    Strength is V/V in the upper and lower limbs.      Sensation: intact to LT     Reflex Exam:  DTR's:    Deep tendon reflexes in the upper and lower extremities are normal bilaterally.   Toes:    The toes are downgoing bilaterally.   Clonus:    Clonus is absent.    Assessment/Plan:  39 year old failed multiple medications with chronic migraines, had a long discussion will start Botox for migraines. Transfer of care from my wonderful headache colleague Dr. Catalina Lunger.   Via chart review medications tried that can be used for migraine management, failed or not tolerated: Propranolol, Effexor, nortriptyline,topamax, bupropion, escitalopram, naratriptan, Emgality, Zofran.  Excedrin, Benadryl, Allegra, Sudafed, Phenergan, Aleve, Flexeril, Klonopin,tylenol, ibuprofen, oxycodone, maxalt, imitrex po  Discussed Brit PT for dry needling We also discussed half-lives of medications and how she would use Frova for example with menstrual migraines, we discussed the half-life of Nurtec and she can try that instead of Ubrelvy, we discussed multiple strategies for treating migraines but I do think the best thing at this point would be Botox for her and I think we can get her under fantastic control with botulinum toxin for migraines. Meds ordered this encounter  Medications   Rimegepant Sulfate (NURTEC) 75 MG TBDP    Sig: Take 75 mg by mouth daily as needed. For migraines. Take as close to onset of migraine as possible. One daily maximum.    Dispense:  10 tablet    Refill:  0   frovatriptan (FROVA) 2.5 MG tablet    Sig: Take 1 tablet (2.5 mg total) by mouth as needed for migraine. If recurs, may repeat after 2 hours. Max of 3 tabs in 24 hours.     Dispense:  10 tablet    Refill:  3   Discussed: To prevent or relieve headaches, try the following: Cool Compress. Lie down and place a cool compress on your head.  Avoid headache triggers. If certain foods or odors seem to have triggered your migraines in the past, avoid them. A headache diary might help you identify triggers.  Include physical activity in your daily routine. Try a daily walk or other moderate aerobic exercise.  Manage stress. Find healthy ways to cope with the stressors, such as delegating tasks on your to-do list.  Practice relaxation techniques. Try deep breathing, yoga, massage and visualization.  Eat regularly. Eating regularly scheduled meals and maintaining a healthy diet might help prevent headaches. Also, drink plenty of fluids.  Follow a regular sleep schedule. Sleep deprivation might contribute to headaches Consider biofeedback. With this mind-body technique, you learn to control certain bodily functions -- such as muscle tension, heart rate and blood pressure -- to prevent headaches or reduce headache pain.    Proceed to emergency room if you experience new or worsening symptoms or symptoms do not resolve, if you have new neurologic symptoms or if headache is severe, or for any concerning symptom.   Provided education and documentation from American headache Society toolbox including articles on: chronic migraine medication overuse headache, chronic migraines, prevention of migraines, behavioral and other nonpharmacologic treatments for headache.   Cc: Annia Belt, MD,  Laurann Montana, MD  Naomie Dean, MD  I spent 60 minutes of face-to-face and non-face-to-face time with patient on the  1. Chronic migraine without aura, with intractable migraine, so stated,  with status migrainosus   2. Intractable menstrual migraine with status migrainosus    diagnosis.  This included previsit chart review, lab review, study review, order entry, electronic health record  documentation, patient education on the different diagnostic and therapeutic options, counseling and coordination of care, risks and benefits of management, compliance, or risk factor reduction   [facility] [address] [place], [place] [address]  Phone [phone] Fax [phone]

## 2019-07-14 ENCOUNTER — Other Ambulatory Visit: Payer: Self-pay

## 2019-07-14 ENCOUNTER — Ambulatory Visit (INDEPENDENT_AMBULATORY_CARE_PROVIDER_SITE_OTHER): Payer: 59 | Admitting: Neurology

## 2019-07-14 ENCOUNTER — Telehealth: Payer: Self-pay | Admitting: *Deleted

## 2019-07-14 VITALS — Temp 97.1°F

## 2019-07-14 DIAGNOSIS — G43711 Chronic migraine without aura, intractable, with status migrainosus: Secondary | ICD-10-CM

## 2019-07-14 NOTE — Progress Notes (Signed)

## 2019-07-14 NOTE — Progress Notes (Signed)
Botox consent signed today Botox- 200 units x 1 vial Lot: [id number] Expiration: 02/2022 NDC: 0023-3921-02  Bacteriostatic 0.9% Sodium Chloride- 4mL total Lot: [id number] Expiration: 09/18/2019 NDC: 0409-1966-02  Dx: G43.711 B/B  

## 2019-07-14 NOTE — Telephone Encounter (Signed)
Completed Frovatriptan PA on Cover My Meds. KEY: [id number]. Awaiting [employer].

## 2019-07-15 MED FILL — FROVATRIPTAN SUCC 2.5 MG TA: 2.5 | 17 days supply | Qty: 10 | Fill #0

## 2019-07-15 NOTE — Telephone Encounter (Addendum)
Per Cover My Med[employer], Frovatriptan has been approved.   The request has been approved. The authorization is effective for a maximum of 12 fills from 07/14/2019 to 07/12/2020, as long as the member is enrolled in their current health plan. The request was approved as submitted. A written notification letter will follow with additional details.  Approval letter received from [employer]. I faxed this to pt's pharmacy. Received a receipt of confirmation.

## 2019-07-16 ENCOUNTER — Other Ambulatory Visit (HOSPITAL_COMMUNITY): Payer: Self-pay | Admitting: Family Medicine

## 2019-07-16 DIAGNOSIS — F419 Anxiety disorder, unspecified: Secondary | ICD-10-CM | POA: Diagnosis not present

## 2019-07-19 MED FILL — EMGALITY 120 MG/ML SOAJ: 120 | 30 days supply | Qty: 1 | Fill #1

## 2019-07-22 IMAGING — DX DG KNEE COMPLETE 4+V*R*
4 series · 4 of 4 positions shown · non-contrast
Comparison: None.

CLINICAL DATA: 35-year-old former dancer with long-standing
intermittent right knee pain, presenting with three-day history of
persistent right knee pain localizing behind the patella and
medially. No recent injuries.

EXAM:
RIGHT KNEE - COMPLETE 4+ VIEW

[knee ap]
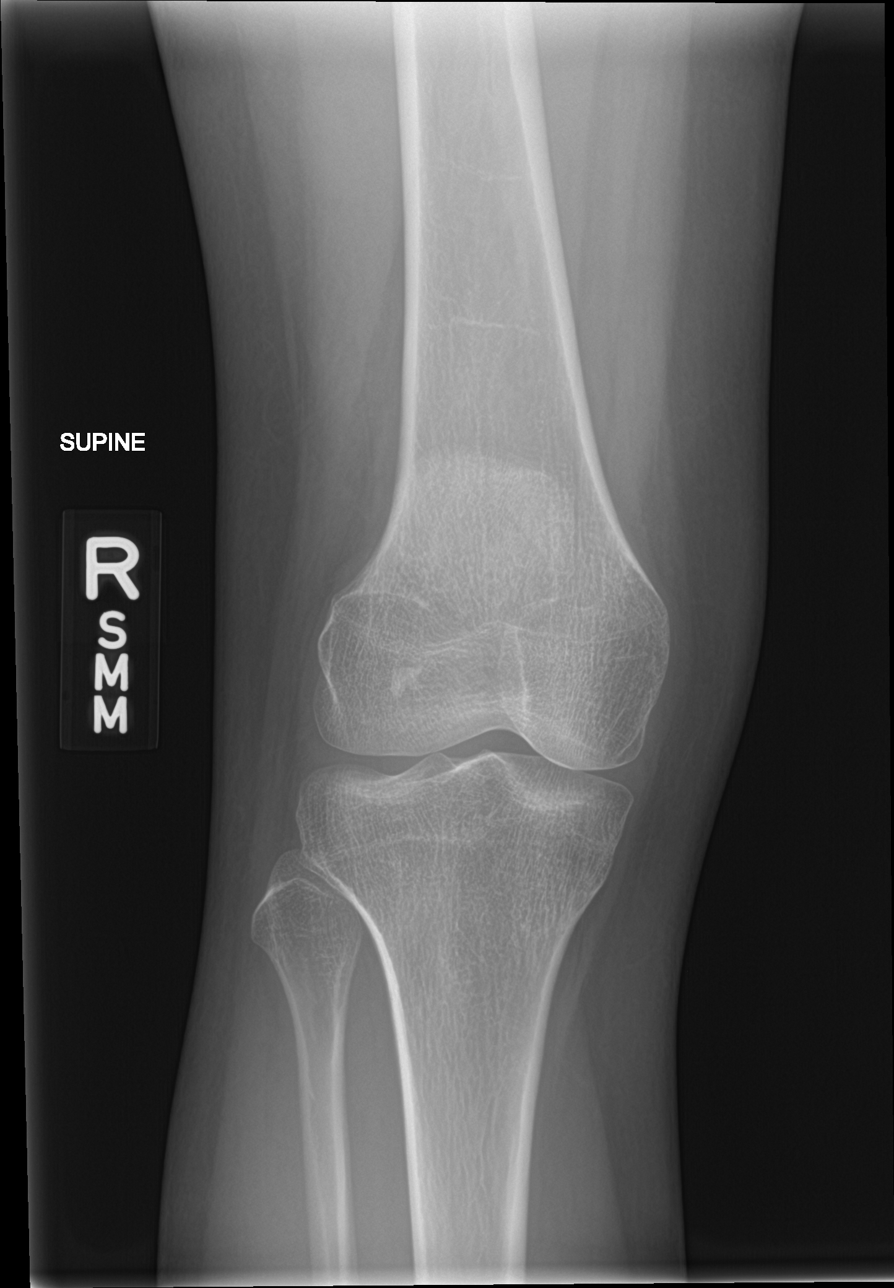

[knee obl (1 of 2)]
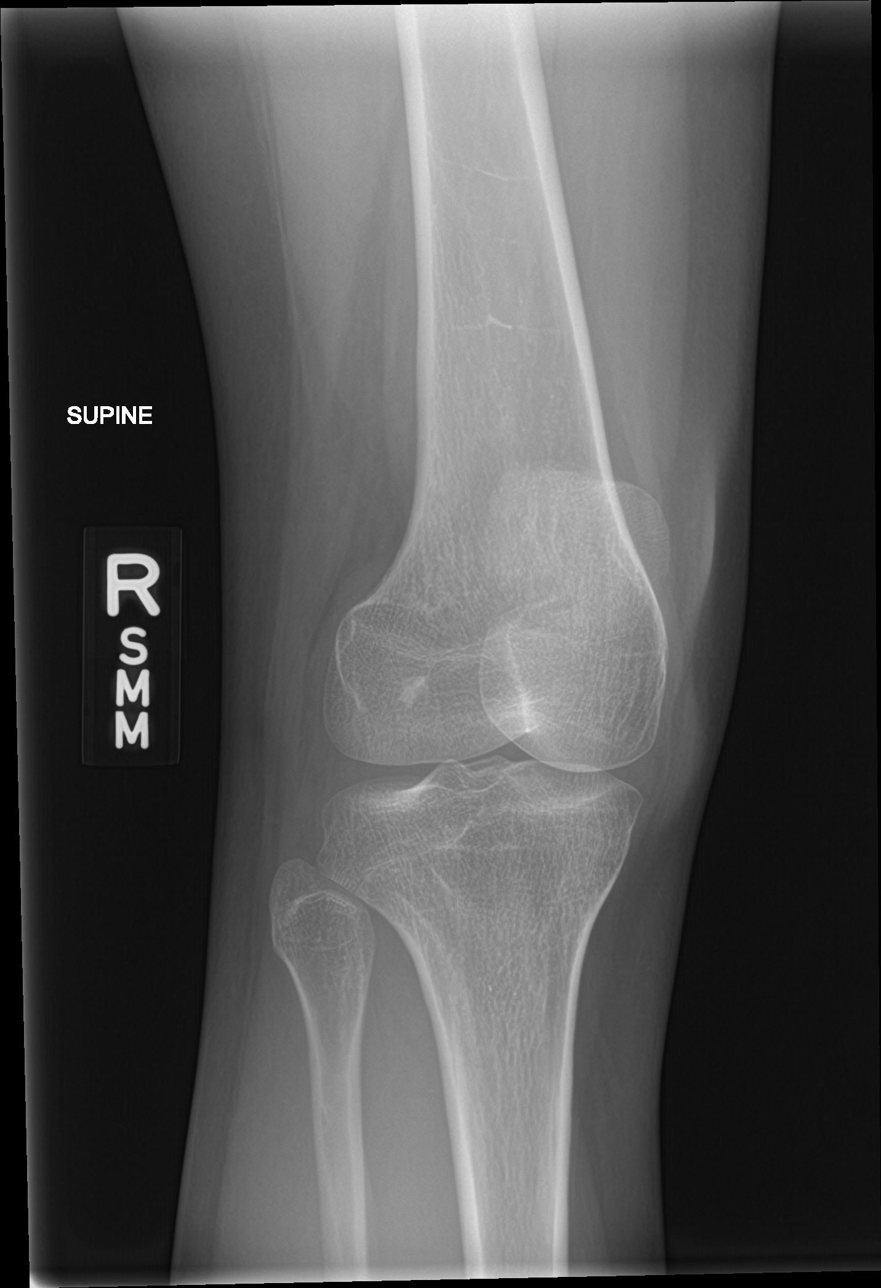

[knee obl (2 of 2)]
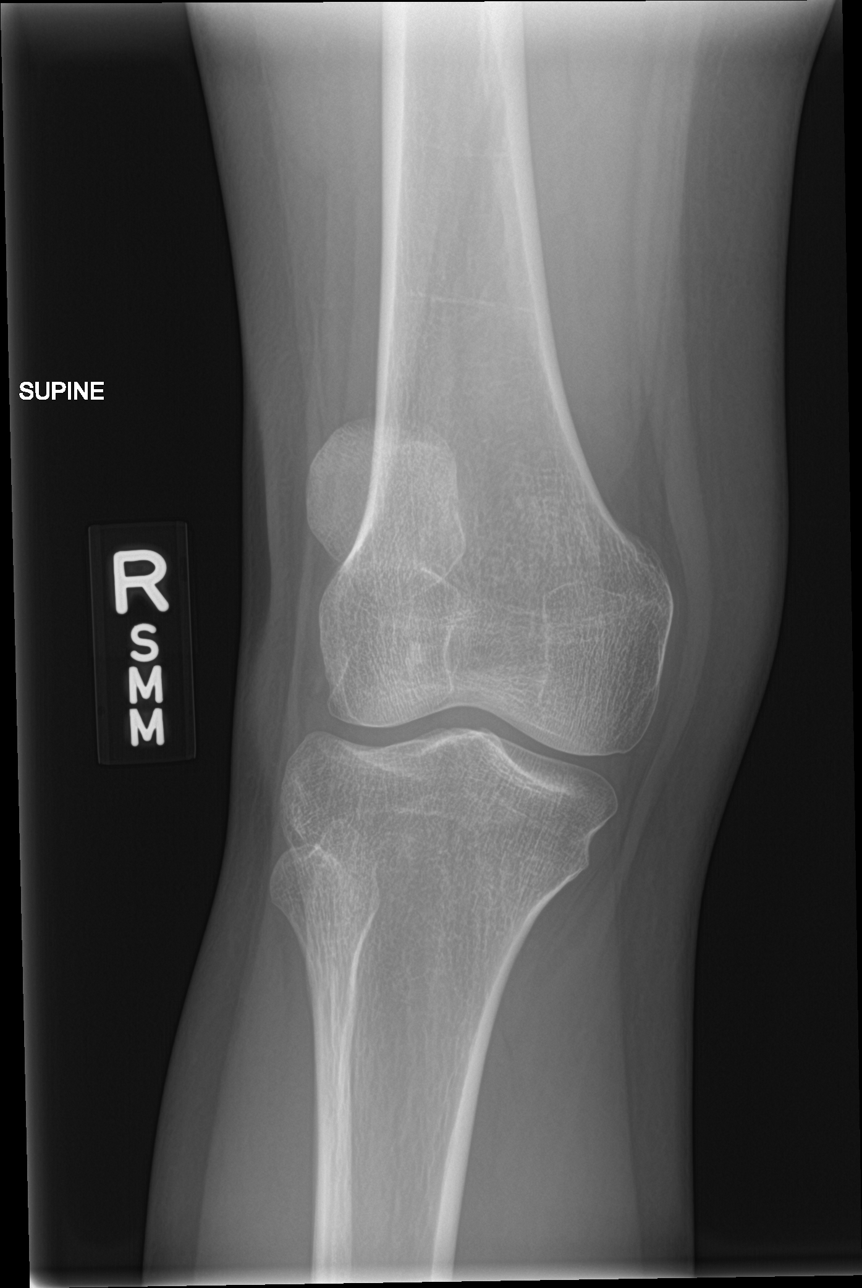

[knee lat]
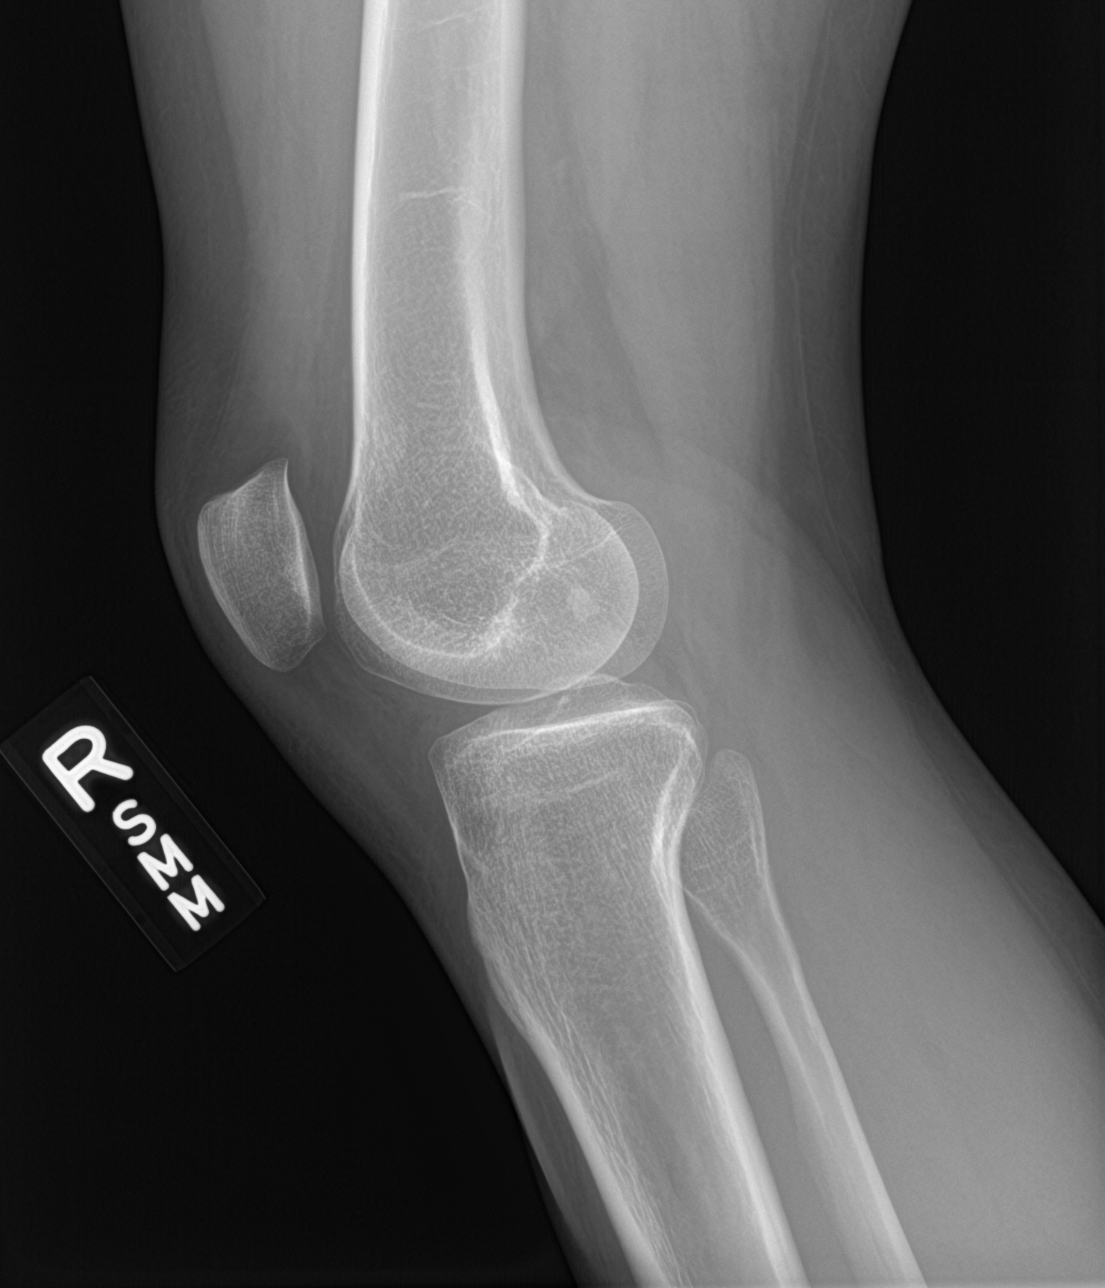

[4 of 4 positions shown; findings below may reference images not displayed]

FINDINGS: No evidence of acute fracture or dislocation. Well-preserved joint
spaces. Well-preserved bone mineral density. Benign bone island in
the lateral femoral condyle. No significant intrinsic osseous
abnormality. No visible joint effusion.
IMPRESSION: No acute or significant osseous abnormality.

## 2019-07-23 ENCOUNTER — Telehealth: Payer: Self-pay | Admitting: *Deleted

## 2019-07-23 NOTE — Telephone Encounter (Signed)
I called [facility] [phone] and spoke to [place]. She states that J0585 and 49611 are valid and do not require PA.  Ref# for this call is [id number].

## 2019-08-12 DIAGNOSIS — E039 Hypothyroidism, unspecified: Secondary | ICD-10-CM | POA: Diagnosis not present

## 2019-08-14 MED FILL — EMGALITY 120 MG/ML SOAJ: 120 | 30 days supply | Qty: 1 | Fill #2

## 2019-08-14 MED FILL — NORETHIND-ETH ESTRAD 1-0.02: 1-20 | 84 days supply | Qty: 84 | Fill #3

## 2019-08-19 ENCOUNTER — Other Ambulatory Visit (HOSPITAL_COMMUNITY): Payer: Self-pay | Admitting: Endocrinology

## 2019-08-19 DIAGNOSIS — E039 Hypothyroidism, unspecified: Secondary | ICD-10-CM | POA: Diagnosis not present

## 2019-08-19 MED FILL — LEVOTHYROXINE 88 MCG TABLET: 88 | 90 days supply | Qty: 90 | Fill #0

## 2019-08-20 DIAGNOSIS — F411 Generalized anxiety disorder: Secondary | ICD-10-CM | POA: Diagnosis not present

## 2019-09-01 MED FILL — LEVOTHYROXINE 88 MCG TABLET: 88 | 90 days supply | Qty: 90 | Fill #0

## 2019-09-11 MED FILL — EMGALITY 120 MG/ML SOAJ: 120 | 30 days supply | Qty: 1 | Fill #3

## 2019-09-11 MED FILL — FROVATRIPTAN SUCC 2.5 MG TA: 2.5 | 17 days supply | Qty: 10 | Fill #1

## 2019-09-11 MED FILL — ESCITALOPRAM 10 MG TABLET: 10 | 90 days supply | Qty: 90 | Fill #1

## 2019-10-08 MED FILL — buPROPion HCL ER (XL) 150 M: 150 | 90 days supply | Qty: 90 | Fill #0

## 2019-10-09 ENCOUNTER — Other Ambulatory Visit (HOSPITAL_COMMUNITY): Payer: Self-pay | Admitting: Family Medicine

## 2019-10-10 MED FILL — EMGALITY 120 MG/ML SOAJ: 120 | 30 days supply | Qty: 1 | Fill #0

## 2019-10-14 ENCOUNTER — Ambulatory Visit (INDEPENDENT_AMBULATORY_CARE_PROVIDER_SITE_OTHER): Payer: 59 | Admitting: Neurology

## 2019-10-14 DIAGNOSIS — G43711 Chronic migraine without aura, intractable, with status migrainosus: Secondary | ICD-10-CM

## 2019-10-14 NOTE — Progress Notes (Signed)
Botox- 200 units x 1 vial Lot: [id number] Expiration: 06/2022 NDC: 0023-3921-02  Bacteriostatic 0.9% Sodium Chloride- 4mL total Lot: [id number] Expiration: 12/18/2020 NDC: 0409-1966-02  Dx: G43.711 B/B  

## 2019-10-14 NOTE — Progress Notes (Signed)
Consent Form Botulism Toxin Injection For Chronic Migraine  10/14/2019:   Reviewed orally with patient, additionally signature is on file:  Botulism toxin has been approved by the Federal drug administration for treatment of chronic migraine. Botulism toxin does not cure chronic migraine and it may not be effective in some patients.  The administration of botulism toxin is accomplished by injecting a small amount of toxin into the muscles of the neck and head. Dosage must be titrated for each individual. Any benefits resulting from botulism toxin tend to wear off after 3 months with a repeat injection required if benefit is to be maintained. Injections are usually done every 3-4 months with maximum effect peak achieved by about 2 or 3 weeks. Botulism toxin is expensive and you should be sure of what costs you will incur resulting from the injection.  The side effects of botulism toxin use for chronic migraine may include:   -Transient, and usually mild, facial weakness with facial injections  -Transient, and usually mild, head or neck weakness with head/neck injections  -Reduction or loss of forehead facial animation due to forehead muscle weakness  -Eyelid drooping  -Dry eye  -Pain at the site of injection or bruising at the site of injection  -Double vision  -Potential unknown long term risks  Contraindications: You should not have Botox if you are pregnant, nursing, allergic to albumin, have an infection, skin condition, or muscle weakness at the site of the injection, or have myasthenia gravis, Lambert-Eaton syndrome, or ALS.  It is also possible that as with any injection, there may be an allergic reaction or no effect from the medication. Reduced effectiveness after repeated injections is sometimes seen and rarely infection at the injection site may occur. All care will be taken to prevent these side effects. If therapy is given over a long time, atrophy and wasting in the muscle  injected may occur. Occasionally the patient's become refractory to treatment because they develop antibodies to the toxin. In this event, therapy needs to be modified.  I have read the above information and consent to the administration of botulism toxin.    BOTOX PROCEDURE NOTE FOR MIGRAINE HEADACHE    Contraindications and precautions discussed with patient(above). Aseptic procedure was observed and patient tolerated procedure. Procedure performed by Dr. Artemio Aly  The condition has existed for more than 6 months, and pt does not have a diagnosis of ALS, Myasthenia Gravis or Lambert-Eaton Syndrome.  Risks and benefits of injections discussed and pt agrees to proceed with the procedure.  Written consent obtained  These injections are medically necessary. Pt  receives good benefits from these injections. These injections do not cause sedations or hallucinations which the oral therapies may cause.  Description of procedure:  The patient was placed in a sitting position. The standard protocol was used for Botox as follows, with 5 units of Botox injected at each site:   -Procerus muscle, midline injection  -Corrugator muscle, bilateral injection  -Frontalis muscle, bilateral injection, with 2 sites each side, medial injection was performed in the upper one third of the frontalis muscle, in the region vertical from the medial inferior edge of the superior orbital rim. The lateral injection was again in the upper one third of the forehead vertically above the lateral limbus of the cornea, 1.5 cm lateral to the medial injection site.  -Temporalis muscle injection, 4 sites, bilaterally. The first injection was 3 cm above the tragus of the ear, second injection site was 1.5 cm to  3 cm up from the first injection site in line with the tragus of the ear. The third injection site was 1.5-3 cm forward between the first 2 injection sites. The fourth injection site was 1.5 cm posterior to the second  injection site.   -Occipitalis muscle injection, 3 sites, bilaterally. The first injection was done one half way between the occipital protuberance and the tip of the mastoid process behind the ear. The second injection site was done lateral and superior to the first, 1 fingerbreadth from the first injection. The third injection site was 1 fingerbreadth superiorly and medially from the first injection site.  -Cervical paraspinal muscle injection, 2 sites, bilateral knee first injection site was 1 cm from the midline of the cervical spine, 3 cm inferior to the lower border of the occipital protuberance. The second injection site was 1.5 cm superiorly and laterally to the first injection site.  -Trapezius muscle injection was performed at 3 sites, bilaterally. The first injection site was in the upper trapezius muscle halfway between the inflection point of the neck, and the acromion. The second injection site was one half way between the acromion and the first injection site. The third injection was done between the first injection site and the inflection point of the neck.   Will return for repeat injection in 3 months.   200 units of Botox was used, any Botox not injected was wasted. The patient tolerated the procedure well, there were no complications of the above procedure.

## 2019-10-15 DIAGNOSIS — Z111 Encounter for screening for respiratory tuberculosis: Secondary | ICD-10-CM | POA: Diagnosis not present

## 2019-10-15 MED ORDER — UBRELVY 100 MG PO TABS: 100.0000 mg | ORAL_TABLET | ORAL | 3 refills | Status: DC | PRN

## 2019-10-15 MED FILL — FROVATRIPTAN SUCCINATE 2.5: 2.5 | 17 days supply | Qty: 10 | Fill #2

## 2019-10-15 MED FILL — UBRELVY 100 MG TABS: 100 | 15 days supply | Qty: 30 | Fill #1

## 2019-10-29 DIAGNOSIS — F411 Generalized anxiety disorder: Secondary | ICD-10-CM | POA: Diagnosis not present

## 2019-11-12 DIAGNOSIS — F411 Generalized anxiety disorder: Secondary | ICD-10-CM | POA: Diagnosis not present

## 2019-11-12 MED FILL — LORazepam 0.5 MG TABS: 0.5 | 30 days supply | Qty: 30 | Fill #0

## 2019-11-12 MED FILL — FROVATRIPTAN SUCCINATE 2.5: 2.5 | 17 days supply | Qty: 10 | Fill #3

## 2019-11-12 MED FILL — NORETHIND-ETH ESTRAD 1-0.02: 1-20 | 84 days supply | Qty: 84 | Fill #0

## 2019-11-12 MED FILL — ONDANSETRON ODT 8 MG TABLET: 8 | 6 days supply | Qty: 20 | Fill #1

## 2019-11-30 DIAGNOSIS — Z20822 Contact with and (suspected) exposure to covid-19: Secondary | ICD-10-CM | POA: Diagnosis not present

## 2019-11-30 DIAGNOSIS — Z03818 Encounter for observation for suspected exposure to other biological agents ruled out: Secondary | ICD-10-CM | POA: Diagnosis not present

## 2019-12-09 DIAGNOSIS — Z20822 Contact with and (suspected) exposure to covid-19: Secondary | ICD-10-CM | POA: Diagnosis not present

## 2019-12-09 MED FILL — ESCITALOPRAM 10 MG TABLET: 10 | 90 days supply | Qty: 90 | Fill #0

## 2019-12-09 MED FILL — LEVOTHYROXINE 88 MCG TABLET: 88 | 90 days supply | Qty: 90 | Fill #1

## 2019-12-10 DIAGNOSIS — F411 Generalized anxiety disorder: Secondary | ICD-10-CM | POA: Diagnosis not present

## 2019-12-24 DIAGNOSIS — Z20828 Contact with and (suspected) exposure to other viral communicable diseases: Secondary | ICD-10-CM | POA: Diagnosis not present

## 2019-12-24 DIAGNOSIS — F411 Generalized anxiety disorder: Secondary | ICD-10-CM | POA: Diagnosis not present

## 2020-01-20 MED FILL — buPROPion HCL ER (XL) 150 M: 150 | 90 days supply | Qty: 90 | Fill #1

## 2020-01-21 DIAGNOSIS — F411 Generalized anxiety disorder: Secondary | ICD-10-CM | POA: Diagnosis not present

## 2020-01-22 ENCOUNTER — Ambulatory Visit (INDEPENDENT_AMBULATORY_CARE_PROVIDER_SITE_OTHER): Payer: 59 | Admitting: Neurology

## 2020-01-22 ENCOUNTER — Other Ambulatory Visit: Payer: Self-pay | Admitting: Neurology

## 2020-01-22 ENCOUNTER — Encounter: Payer: Self-pay | Admitting: Neurology

## 2020-01-22 DIAGNOSIS — G43711 Chronic migraine without aura, intractable, with status migrainosus: Secondary | ICD-10-CM | POA: Diagnosis not present

## 2020-01-22 MED ORDER — NURTEC 75 MG PO TBDP
75.0000 mg | ORAL_TABLET | Freq: Every day | ORAL | 11 refills | Status: DC | PRN
Start: 2020-01-22 — End: 2020-11-02

## 2020-01-22 MED ORDER — PROCHLORPERAZINE MALEATE 10 MG PO TABS: 10.0000 mg | ORAL_TABLET | Freq: Four times a day (QID) | ORAL | 11 refills | Status: DC | PRN

## 2020-01-22 MED FILL — PROCHLORPERAZINE 10 MG TAB: 10 | 7 days supply | Qty: 30 | Fill #0

## 2020-01-22 NOTE — Progress Notes (Signed)
Botox- 100 units x 2 vials Lot: [id number] Expiration: 08/2022 NDC: 1115-5208-02  Bacteriostatic 0.9% Sodium Chloride- 49mL total Lot: [id number] Expiration: 04/20/2021 NDC: 2449-7530-05  Dx: R10.211 B/B

## 2020-01-22 NOTE — Progress Notes (Signed)
Consent Form Botulism Toxin Injection For Chronic Migraine  Doing excellent, > 60% improvement in migraine frequency. +5u each orb oculi for pain behind the eyes. No masseters. +15u each extra bilaterally cervical paraspinals and traps (worse on her right)  Qulipta samples: she will call us to let us know if she would like a prescription  Meds ordered this encounter  Medications  . prochlorperazine (COMPAZINE) 10 MG tablet    Sig: Take 1 tablet (10 mg total) by mouth every 6 (six) hours as needed for nausea or vomiting.    Dispense:  30 tablet    Refill:  11  . Rimegepant Sulfate (NURTEC) 75 MG TBDP    Sig: Take 75 mg by mouth daily as needed. For migraines. Take as close to onset of migraine as possible. One daily maximum.    Dispense:  8 tablet    Refill:  11    8 or max allowed by insurance    Reviewed orally with patient, additionally signature is on file:  Botulism toxin has been approved by the Federal drug administration for treatment of chronic migraine. Botulism toxin does not cure chronic migraine and it may not be effective in some patients.  The administration of botulism toxin is accomplished by injecting a small amount of toxin into the muscles of the neck and head. Dosage must be titrated for each individual. Any benefits resulting from botulism toxin tend to wear off after 3 months with a repeat injection required if benefit is to be maintained. Injections are usually done every 3-4 months with maximum effect peak achieved by about 2 or 3 weeks. Botulism toxin is expensive and you should be sure of what costs you will incur resulting from the injection.  The side effects of botulism toxin use for chronic migraine may include:   -Transient, and usually mild, facial weakness with facial injections  -Transient, and usually mild, head or neck weakness with head/neck injections  -Reduction or loss of forehead facial animation due to forehead muscle weakness  -Eyelid  drooping  -Dry eye  -Pain at the site of injection or bruising at the site of injection  -Double vision  -Potential unknown long term risks  Contraindications: You should not have Botox if you are pregnant, nursing, allergic to albumin, have an infection, skin condition, or muscle weakness at the site of the injection, or have myasthenia gravis, Lambert-Eaton syndrome, or ALS.  It is also possible that as with any injection, there may be an allergic reaction or no effect from the medication. Reduced effectiveness after repeated injections is sometimes seen and rarely infection at the injection site may occur. All care will be taken to prevent these side effects. If therapy is given over a long time, atrophy and wasting in the muscle injected may occur. Occasionally the patient's become refractory to treatment because they develop antibodies to the toxin. In this event, therapy needs to be modified.  I have read the above information and consent to the administration of botulism toxin.    BOTOX PROCEDURE NOTE FOR MIGRAINE HEADACHE    Contraindications and precautions discussed with patient(above). Aseptic procedure was observed and patient tolerated procedure. Procedure performed by Dr. Artemio Aly  The condition has existed for more than 6 months, and pt does not have a diagnosis of ALS, Myasthenia Gravis or Lambert-Eaton Syndrome.  Risks and benefits of injections discussed and pt agrees to proceed with the procedure.  Written consent obtained  These injections are medically necessary. Pt  receives good benefits from these injections. These injections do not cause sedations or hallucinations which the oral therapies may cause.  Description of procedure:  The patient was placed in a sitting position. The standard protocol was used for Botox as follows, with 5 units of Botox injected at each site:   -Procerus muscle, midline injection  -Corrugator muscle, bilateral  injection  -Frontalis muscle, bilateral injection, with 2 sites each side, medial injection was performed in the upper one third of the frontalis muscle, in the region vertical from the medial inferior edge of the superior orbital rim. The lateral injection was again in the upper one third of the forehead vertically above the lateral limbus of the cornea, 1.5 cm lateral to the medial injection site.  -Temporalis muscle injection, 4 sites, bilaterally. The first injection was 3 cm above the tragus of the ear, second injection site was 1.5 cm to 3 cm up from the first injection site in line with the tragus of the ear. The third injection site was 1.5-3 cm forward between the first 2 injection sites. The fourth injection site was 1.5 cm posterior to the second injection site.   -Occipitalis muscle injection, 3 sites, bilaterally. The first injection was done one half way between the occipital protuberance and the tip of the mastoid process behind the ear. The second injection site was done lateral and superior to the first, 1 fingerbreadth from the first injection. The third injection site was 1 fingerbreadth superiorly and medially from the first injection site.  -Cervical paraspinal muscle injection, 2 sites, bilateral knee first injection site was 1 cm from the midline of the cervical spine, 3 cm inferior to the lower border of the occipital protuberance. The second injection site was 1.5 cm superiorly and laterally to the first injection site.  -Trapezius muscle injection was performed at 3 sites, bilaterally. The first injection site was in the upper trapezius muscle halfway between the inflection point of the neck, and the acromion. The second injection site was one half way between the acromion and the first injection site. The third injection was done between the first injection site and the inflection point of the neck.   Will return for repeat injection in 3 months.   200 units of Botox was  used, any Botox not injected was wasted. The patient tolerated the procedure well, there were no complications of the above procedure.

## 2020-01-26 ENCOUNTER — Other Ambulatory Visit: Payer: Self-pay | Admitting: Neurology

## 2020-01-26 MED FILL — LORazepam 0.5 MG TABS: 0.5 | 30 days supply | Qty: 30 | Fill #1

## 2020-01-26 MED FILL — ONDANSETRON ODT 8 MG TABLET: 8 | 6 days supply | Qty: 20 | Fill #2

## 2020-01-26 MED FILL — NORETHIND-ETH ESTRAD 1-0.02: 1-20 | 84 days supply | Qty: 84 | Fill #1

## 2020-01-27 ENCOUNTER — Ambulatory Visit: Payer: 59 | Admitting: Neurology

## 2020-01-27 ENCOUNTER — Other Ambulatory Visit: Payer: Self-pay | Admitting: Neurology

## 2020-01-27 MED FILL — FROVATRIPTAN SUCCINATE 2.5: 2.5 | 17 days supply | Qty: 10 | Fill #0

## 2020-01-27 MED FILL — RIZATRIPTAN BENZOATE 10 MG: 10 | 30 days supply | Qty: 9 | Fill #0

## 2020-02-04 DIAGNOSIS — F411 Generalized anxiety disorder: Secondary | ICD-10-CM | POA: Diagnosis not present

## 2020-02-11 DIAGNOSIS — F419 Anxiety disorder, unspecified: Secondary | ICD-10-CM | POA: Diagnosis not present

## 2020-02-11 DIAGNOSIS — E039 Hypothyroidism, unspecified: Secondary | ICD-10-CM | POA: Diagnosis not present

## 2020-02-11 DIAGNOSIS — G43909 Migraine, unspecified, not intractable, without status migrainosus: Secondary | ICD-10-CM | POA: Diagnosis not present

## 2020-02-11 DIAGNOSIS — F3342 Major depressive disorder, recurrent, in full remission: Secondary | ICD-10-CM | POA: Diagnosis not present

## 2020-02-18 DIAGNOSIS — F411 Generalized anxiety disorder: Secondary | ICD-10-CM | POA: Diagnosis not present

## 2020-02-23 ENCOUNTER — Telehealth: Payer: Self-pay | Admitting: *Deleted

## 2020-02-23 NOTE — Telephone Encounter (Signed)
Enrollment form for Qulipta 60 mg daily (30 tablets) with 11 refills completed, signed by Dr Lucia Gaskins, and faxed to [facility]. Received a receipt of confirmation. Patient aware (in [employer]) that Bennie Pierini will reach out to her.

## 2020-02-26 NOTE — Telephone Encounter (Signed)
Rep Derick Degroot called to inform provider that the pt has been approved for the [facility] and if there are any questions you can call [phone]

## 2020-03-01 NOTE — Telephone Encounter (Signed)
Noted. I messaged the pt in mychart to let her know. Bennie Pierini should reach out to pt directly to schedule shipping.

## 2020-03-10 MED FILL — LEVOTHYROXINE 88 MCG TABLET: 88 | 90 days supply | Qty: 90 | Fill #2

## 2020-03-10 MED FILL — ESCITALOPRAM 10 MG TABLET: 10 | 90 days supply | Qty: 90 | Fill #1

## 2020-03-26 DIAGNOSIS — Z20822 Contact with and (suspected) exposure to covid-19: Secondary | ICD-10-CM | POA: Diagnosis not present

## 2020-04-14 ENCOUNTER — Other Ambulatory Visit (HOSPITAL_COMMUNITY)
Admission: RE | Admit: 2020-04-14 | Discharge: 2020-04-14 | Disposition: A | Discharge status: Home / Self Care | Payer: 59 | Source: Ambulatory Visit | Attending: Family Medicine | Admitting: Family Medicine

## 2020-04-14 ENCOUNTER — Other Ambulatory Visit: Payer: Self-pay | Admitting: Family Medicine

## 2020-04-14 DIAGNOSIS — E039 Hypothyroidism, unspecified: Secondary | ICD-10-CM | POA: Diagnosis not present

## 2020-04-14 DIAGNOSIS — Z789 Other specified health status: Secondary | ICD-10-CM | POA: Diagnosis not present

## 2020-04-14 DIAGNOSIS — F3342 Major depressive disorder, recurrent, in full remission: Secondary | ICD-10-CM | POA: Diagnosis not present

## 2020-04-14 DIAGNOSIS — Z13220 Encounter for screening for lipoid disorders: Secondary | ICD-10-CM | POA: Diagnosis not present

## 2020-04-14 DIAGNOSIS — F411 Generalized anxiety disorder: Secondary | ICD-10-CM | POA: Diagnosis not present

## 2020-04-14 DIAGNOSIS — Z124 Encounter for screening for malignant neoplasm of cervix: Secondary | ICD-10-CM | POA: Insufficient documentation

## 2020-04-14 DIAGNOSIS — Z0000 Encounter for general adult medical examination without abnormal findings: Secondary | ICD-10-CM | POA: Diagnosis not present

## 2020-04-14 DIAGNOSIS — N938 Other specified abnormal uterine and vaginal bleeding: Secondary | ICD-10-CM | POA: Diagnosis not present

## 2020-04-14 DIAGNOSIS — Z1159 Encounter for screening for other viral diseases: Secondary | ICD-10-CM | POA: Diagnosis not present

## 2020-04-14 MED FILL — NORETHIND-ETH ESTRAD 1-0.02: 1-20 | 63 days supply | Qty: 84 | Fill #0

## 2020-04-14 MED FILL — buPROPion HCL ER (XL) 150 M: 150 | 90 days supply | Qty: 90 | Fill #0

## 2020-04-14 MED FILL — LORazepam 0.5 MG TABS: 0.5 | 30 days supply | Qty: 30 | Fill #0

## 2020-04-15 ENCOUNTER — Other Ambulatory Visit: Payer: Self-pay

## 2020-04-15 ENCOUNTER — Ambulatory Visit (INDEPENDENT_AMBULATORY_CARE_PROVIDER_SITE_OTHER): Payer: 59 | Admitting: Neurology

## 2020-04-15 DIAGNOSIS — G43711 Chronic migraine without aura, intractable, with status migrainosus: Secondary | ICD-10-CM | POA: Diagnosis not present

## 2020-04-15 NOTE — Progress Notes (Signed)
Botox- 200 units x 1 vial Lot: [id number] Expiration: 12/2022 NDC: 0023-3921-02  Bacteriostatic 0.9% Sodium Chloride- 4mL total Lot: [id number] Expiration: 04/20/2021 NDC: 0409-1966-02  Dx: G43.711 B/B  

## 2020-04-15 NOTE — Progress Notes (Signed)
Consent Form Botulism Toxin Injection For Chronic Migraine  Doing excellent, > 80% improvement in migraine frequency. +5u each orb oculi for pain behind the eyes. No masseters. +15u each extra bilaterally cervical paraspinals and traps (worse on her right). Loves qulipta and Ajovy. Together she is in the best place she has ever been with migraine management.  No orders of the defined types were placed in this encounter.   Reviewed orally with patient, additionally signature is on file:  Botulism toxin has been approved by the Federal drug administration for treatment of chronic migraine. Botulism toxin does not cure chronic migraine and it may not be effective in some patients.  The administration of botulism toxin is accomplished by injecting a small amount of toxin into the muscles of the neck and head. Dosage must be titrated for each individual. Any benefits resulting from botulism toxin tend to wear off after 3 months with a repeat injection required if benefit is to be maintained. Injections are usually done every 3-4 months with maximum effect peak achieved by about 2 or 3 weeks. Botulism toxin is expensive and you should be sure of what costs you will incur resulting from the injection.  The side effects of botulism toxin use for chronic migraine may include:   -Transient, and usually mild, facial weakness with facial injections  -Transient, and usually mild, head or neck weakness with head/neck injections  -Reduction or loss of forehead facial animation due to forehead muscle weakness  -Eyelid drooping  -Dry eye  -Pain at the site of injection or bruising at the site of injection  -Double vision  -Potential unknown long term risks  Contraindications: You should not have Botox if you are pregnant, nursing, allergic to albumin, have an infection, skin condition, or muscle weakness at the site of the injection, or have myasthenia gravis, Lambert-Eaton syndrome, or ALS.  It is  also possible that as with any injection, there may be an allergic reaction or no effect from the medication. Reduced effectiveness after repeated injections is sometimes seen and rarely infection at the injection site may occur. All care will be taken to prevent these side effects. If therapy is given over a long time, atrophy and wasting in the muscle injected may occur. Occasionally the patient's become refractory to treatment because they develop antibodies to the toxin. In this event, therapy needs to be modified.  I have read the above information and consent to the administration of botulism toxin.    BOTOX PROCEDURE NOTE FOR MIGRAINE HEADACHE    Contraindications and precautions discussed with patient(above). Aseptic procedure was observed and patient tolerated procedure. Procedure performed by Dr. Artemio Aly  The condition has existed for more than 6 months, and pt does not have a diagnosis of ALS, Myasthenia Gravis or Lambert-Eaton Syndrome.  Risks and benefits of injections discussed and pt agrees to proceed with the procedure.  Written consent obtained  These injections are medically necessary. Pt  receives good benefits from these injections. These injections do not cause sedations or hallucinations which the oral therapies may cause.  Description of procedure:  The patient was placed in a sitting position. The standard protocol was used for Botox as follows, with 5 units of Botox injected at each site:   -Procerus muscle, midline injection  -Corrugator muscle, bilateral injection  -Frontalis muscle, bilateral injection, with 2 sites each side, medial injection was performed in the upper one third of the frontalis muscle, in the region vertical from the medial  inferior edge of the superior orbital rim. The lateral injection was again in the upper one third of the forehead vertically above the lateral limbus of the cornea, 1.5 cm lateral to the medial injection  site.  -Temporalis muscle injection, 4 sites, bilaterally. The first injection was 3 cm above the tragus of the ear, second injection site was 1.5 cm to 3 cm up from the first injection site in line with the tragus of the ear. The third injection site was 1.5-3 cm forward between the first 2 injection sites. The fourth injection site was 1.5 cm posterior to the second injection site.   -Occipitalis muscle injection, 3 sites, bilaterally. The first injection was done one half way between the occipital protuberance and the tip of the mastoid process behind the ear. The second injection site was done lateral and superior to the first, 1 fingerbreadth from the first injection. The third injection site was 1 fingerbreadth superiorly and medially from the first injection site.  -Cervical paraspinal muscle injection, 2 sites, bilateral knee first injection site was 1 cm from the midline of the cervical spine, 3 cm inferior to the lower border of the occipital protuberance. The second injection site was 1.5 cm superiorly and laterally to the first injection site.  -Trapezius muscle injection was performed at 3 sites, bilaterally. The first injection site was in the upper trapezius muscle halfway between the inflection point of the neck, and the acromion. The second injection site was one half way between the acromion and the first injection site. The third injection was done between the first injection site and the inflection point of the neck.   Will return for repeat injection in 3 months.   200 units of Botox was used, any Botox not injected was wasted. The patient tolerated the procedure well, there were no complications of the above procedure.

## 2020-04-16 LAB — CYTOLOGY - PAP
Comment: NEGATIVE
Diagnosis: NEGATIVE
High risk HPV: NEGATIVE

## 2020-04-28 DIAGNOSIS — F411 Generalized anxiety disorder: Secondary | ICD-10-CM | POA: Diagnosis not present

## 2020-04-29 MED FILL — buPROPion HCL ER (XL) 150 M: 150 | 90 days supply | Qty: 90 | Fill #0

## 2020-04-29 MED FILL — NORETHIND-ETH ESTRAD 1-0.02: 1-20 | 84 days supply | Qty: 84 | Fill #0

## 2020-05-12 DIAGNOSIS — F411 Generalized anxiety disorder: Secondary | ICD-10-CM | POA: Diagnosis not present

## 2020-06-07 MED FILL — LEVOTHYROXINE 88 MCG TABLET: 88 | 90 days supply | Qty: 90 | Fill #3

## 2020-06-07 MED FILL — ESCITALOPRAM 10 MG TABLET: 10 | 90 days supply | Qty: 90 | Fill #0

## 2020-07-06 DIAGNOSIS — F411 Generalized anxiety disorder: Secondary | ICD-10-CM | POA: Diagnosis not present

## 2020-07-14 ENCOUNTER — Ambulatory Visit: Payer: Self-pay | Admitting: Neurology

## 2020-07-26 ENCOUNTER — Ambulatory Visit (INDEPENDENT_AMBULATORY_CARE_PROVIDER_SITE_OTHER): Payer: 59 | Admitting: Neurology

## 2020-07-26 DIAGNOSIS — G43711 Chronic migraine without aura, intractable, with status migrainosus: Secondary | ICD-10-CM | POA: Diagnosis not present

## 2020-07-26 DIAGNOSIS — G43009 Migraine without aura, not intractable, without status migrainosus: Secondary | ICD-10-CM

## 2020-07-26 NOTE — Progress Notes (Signed)
Botox- 200 units x 1 vial Lot: [id number] Expiration: 03/2023 NDC: 0023-3921-02  Bacteriostatic 0.9% Sodium Chloride- 4mL total Lot: [id number] Expiration: 08/18/2021 NDC: 0409-1966-02  Dx: G43.711 B/B  

## 2020-07-26 NOTE — Progress Notes (Addendum)
Consent Form Botulism Toxin Injection For Chronic Migraine   07/26/2020: Patient is doing excellent! She now has 4-6 migraine days a month, technically episodic migraines, adding on Qulipta for better migraine control in episodic migraine.  Doing excellent, > 80% improvement in migraine frequency. +5u each orb oculi for pain behind the eyes. No masseters. +15u each extra bilaterally cervical paraspinals and traps (worse on her right). Loves qulipta and Ajovy. Together she is in the best place she has ever been with migraine management.  No orders of the defined types were placed in this encounter.   Reviewed orally with patient, additionally signature is on file:  Botulism toxin has been approved by the Federal drug administration for treatment of chronic migraine. Botulism toxin does not cure chronic migraine and it may not be effective in some patients.  The administration of botulism toxin is accomplished by injecting a small amount of toxin into the muscles of the neck and head. Dosage must be titrated for each individual. Any benefits resulting from botulism toxin tend to wear off after 3 months with a repeat injection required if benefit is to be maintained. Injections are usually done every 3-4 months with maximum effect peak achieved by about 2 or 3 weeks. Botulism toxin is expensive and you should be sure of what costs you will incur resulting from the injection.  The side effects of botulism toxin use for chronic migraine may include:   -Transient, and usually mild, facial weakness with facial injections  -Transient, and usually mild, head or neck weakness with head/neck injections  -Reduction or loss of forehead facial animation due to forehead muscle weakness  -Eyelid drooping  -Dry eye  -Pain at the site of injection or bruising at the site of injection  -Double vision  -Potential unknown long term risks  Contraindications: You should not have Botox if you are pregnant,  nursing, allergic to albumin, have an infection, skin condition, or muscle weakness at the site of the injection, or have myasthenia gravis, Lambert-Eaton syndrome, or ALS.  It is also possible that as with any injection, there may be an allergic reaction or no effect from the medication. Reduced effectiveness after repeated injections is sometimes seen and rarely infection at the injection site may occur. All care will be taken to prevent these side effects. If therapy is given over a long time, atrophy and wasting in the muscle injected may occur. Occasionally the patient's become refractory to treatment because they develop antibodies to the toxin. In this event, therapy needs to be modified.  I have read the above information and consent to the administration of botulism toxin.    BOTOX PROCEDURE NOTE FOR MIGRAINE HEADACHE    Contraindications and precautions discussed with patient(above). Aseptic procedure was observed and patient tolerated procedure. Procedure performed by Dr. Artemio Aly  The condition has existed for more than 6 months, and pt does not have a diagnosis of ALS, Myasthenia Gravis or Lambert-Eaton Syndrome.  Risks and benefits of injections discussed and pt agrees to proceed with the procedure.  Written consent obtained  These injections are medically necessary. Pt  receives good benefits from these injections. These injections do not cause sedations or hallucinations which the oral therapies may cause.  Description of procedure:  The patient was placed in a sitting position. The standard protocol was used for Botox as follows, with 5 units of Botox injected at each site:   -Procerus muscle, midline injection  -Corrugator muscle, bilateral injection  -Frontalis  muscle, bilateral injection, with 2 sites each side, medial injection was performed in the upper one third of the frontalis muscle, in the region vertical from the medial inferior edge of the superior orbital  rim. The lateral injection was again in the upper one third of the forehead vertically above the lateral limbus of the cornea, 1.5 cm lateral to the medial injection site.  -Temporalis muscle injection, 4 sites, bilaterally. The first injection was 3 cm above the tragus of the ear, second injection site was 1.5 cm to 3 cm up from the first injection site in line with the tragus of the ear. The third injection site was 1.5-3 cm forward between the first 2 injection sites. The fourth injection site was 1.5 cm posterior to the second injection site.   -Occipitalis muscle injection, 3 sites, bilaterally. The first injection was done one half way between the occipital protuberance and the tip of the mastoid process behind the ear. The second injection site was done lateral and superior to the first, 1 fingerbreadth from the first injection. The third injection site was 1 fingerbreadth superiorly and medially from the first injection site.  -Cervical paraspinal muscle injection, 2 sites, bilateral knee first injection site was 1 cm from the midline of the cervical spine, 3 cm inferior to the lower border of the occipital protuberance. The second injection site was 1.5 cm superiorly and laterally to the first injection site.  -Trapezius muscle injection was performed at 3 sites, bilaterally. The first injection site was in the upper trapezius muscle halfway between the inflection point of the neck, and the acromion. The second injection site was one half way between the acromion and the first injection site. The third injection was done between the first injection site and the inflection point of the neck.   Will return for repeat injection in 3 months.   200 units of Botox was used, any Botox not injected was wasted. The patient tolerated the procedure well, there were no complications of the above procedure.

## 2020-07-27 NOTE — Telephone Encounter (Signed)
Received a fax from [place] stating patient has been getting Qulipta free over past 150 days. If patient is interested in pursuing continued eligibility, a PA request is required.

## 2020-07-28 DIAGNOSIS — G43009 Migraine without aura, not intractable, without status migrainosus: Secondary | ICD-10-CM | POA: Insufficient documentation

## 2020-07-28 NOTE — Telephone Encounter (Signed)
Completed Qulipta PA on Cover My Meds. Key: [id number]. Awaiting determination from [employer].

## 2020-07-29 ENCOUNTER — Other Ambulatory Visit (HOSPITAL_COMMUNITY): Payer: Self-pay

## 2020-07-29 MED ORDER — UBROGEPANT 100 MG PO TABS
ORAL_TABLET | ORAL | 3 refills | Status: DC
  Filled 2020-07-29: qty 10, 30d supply, fill #0

## 2020-07-29 MED FILL — Bupropion HCl Tab ER 24HR 150 MG: ORAL | 90 days supply | Qty: 90 | Fill #0 | Status: AC

## 2020-07-29 MED FILL — Norethindrone Ace & Ethinyl Estradiol Tab 1 MG-20 MCG: ORAL | 84 days supply | Qty: 84 | Fill #0 | Status: AC

## 2020-07-29 NOTE — Telephone Encounter (Signed)
Per Cover My Meds, [employer] has denied the [place]. Insurance will not allow patient to be on both [place] and Ajovy at the same time.  Spoke with Derk, the patient access rep, to see if patient is eligible for the bridge program despite denial.  His team will review and get back to me. Denial letter faxed to Derk at [phone]. Received a receipt of confirmation.

## 2020-07-29 NOTE — Telephone Encounter (Signed)
I would stop the Ajovy and appeal if patient is ineligible for the bridge program.

## 2020-08-03 DIAGNOSIS — F411 Generalized anxiety disorder: Secondary | ICD-10-CM | POA: Diagnosis not present

## 2020-08-11 ENCOUNTER — Other Ambulatory Visit (HOSPITAL_COMMUNITY): Payer: Self-pay

## 2020-08-11 DIAGNOSIS — E039 Hypothyroidism, unspecified: Secondary | ICD-10-CM | POA: Diagnosis not present

## 2020-08-11 MED ORDER — UBRELVY 100 MG PO TABS
100.0000 mg | ORAL_TABLET | ORAL | 2 refills | Status: DC | PRN
  Filled 2020-08-11: qty 16, 30d supply, fill #0
  Filled 2020-10-23: qty 16, 30d supply, fill #1

## 2020-08-23 ENCOUNTER — Other Ambulatory Visit (HOSPITAL_COMMUNITY): Payer: Self-pay

## 2020-08-23 DIAGNOSIS — E039 Hypothyroidism, unspecified: Secondary | ICD-10-CM | POA: Diagnosis not present

## 2020-08-23 MED ORDER — LEVOTHYROXINE SODIUM 88 MCG PO TABS
ORAL_TABLET | ORAL | 4 refills | Status: DC
  Filled 2020-08-23: qty 90, 90d supply, fill #0
  Filled 2020-12-07: qty 90, 90d supply, fill #1
  Filled 2021-02-28: qty 90, 90d supply, fill #2
  Filled 2021-05-29: qty 90, 90d supply, fill #3
  Filled 2021-08-18: qty 90, 90d supply, fill #4

## 2020-08-31 DIAGNOSIS — F411 Generalized anxiety disorder: Secondary | ICD-10-CM | POA: Diagnosis not present

## 2020-09-07 MED FILL — Escitalopram Oxalate Tab 10 MG (Base Equiv): ORAL | 90 days supply | Qty: 90 | Fill #0 | Status: AC

## 2020-09-08 ENCOUNTER — Other Ambulatory Visit (HOSPITAL_COMMUNITY): Payer: Self-pay

## 2020-09-08 MED FILL — Lorazepam Tab 0.5 MG: ORAL | 30 days supply | Qty: 30 | Fill #0 | Status: AC

## 2020-10-05 ENCOUNTER — Other Ambulatory Visit (HOSPITAL_COMMUNITY): Payer: Self-pay

## 2020-10-05 MED FILL — Norethindrone Ace & Ethinyl Estradiol Tab 1 MG-20 MCG: ORAL | 84 days supply | Qty: 84 | Fill #1 | Status: AC

## 2020-10-12 ENCOUNTER — Other Ambulatory Visit (HOSPITAL_COMMUNITY): Payer: Self-pay

## 2020-10-12 DIAGNOSIS — G43909 Migraine, unspecified, not intractable, without status migrainosus: Secondary | ICD-10-CM | POA: Diagnosis not present

## 2020-10-12 DIAGNOSIS — F411 Generalized anxiety disorder: Secondary | ICD-10-CM | POA: Diagnosis not present

## 2020-10-12 MED ORDER — LORAZEPAM 0.5 MG PO TABS
ORAL_TABLET | ORAL | 1 refills | Status: DC
  Filled 2020-10-12 – 2020-12-07 (×2): qty 30, 30d supply, fill #0
  Filled 2021-02-01: qty 30, 30d supply, fill #1

## 2020-10-12 MED ORDER — BUPROPION HCL ER (XL) 150 MG PO TB24
ORAL_TABLET | ORAL | 1 refills | Status: DC
  Filled 2020-10-12 – 2020-10-23 (×2): qty 90, 90d supply, fill #0
  Filled 2021-01-20: qty 90, 90d supply, fill #1

## 2020-10-12 MED ORDER — ESCITALOPRAM OXALATE 10 MG PO TABS
ORAL_TABLET | ORAL | 1 refills | Status: DC
  Filled 2020-10-12 – 2020-12-07 (×3): qty 90, 90d supply, fill #0
  Filled 2021-02-28: qty 90, 90d supply, fill #1

## 2020-10-13 DIAGNOSIS — Z20822 Contact with and (suspected) exposure to covid-19: Secondary | ICD-10-CM | POA: Diagnosis not present

## 2020-10-23 ENCOUNTER — Other Ambulatory Visit (HOSPITAL_COMMUNITY): Payer: Self-pay

## 2020-10-23 MED FILL — Prochlorperazine Maleate Tab 10 MG (Base Equivalent): ORAL | 7 days supply | Qty: 30 | Fill #0 | Status: AC

## 2020-10-23 MED FILL — Rizatriptan Benzoate Tab 10 MG (Base Equivalent): ORAL | 30 days supply | Qty: 9 | Fill #0 | Status: AC

## 2020-10-25 ENCOUNTER — Telehealth: Payer: Self-pay | Admitting: Neurology

## 2020-10-25 ENCOUNTER — Other Ambulatory Visit (HOSPITAL_COMMUNITY): Payer: Self-pay

## 2020-10-25 MED FILL — Frovatriptan Succinate Tab 2.5 MG (Base Equivalent): ORAL | 30 days supply | Qty: 10 | Fill #0 | Status: CN

## 2020-10-25 NOTE — Telephone Encounter (Signed)
You have mentioned in May that you were going to order this topamax , I dont know dose.  Can be addressed tomorrow.  (Hi Lilliane, I have some qulipta samples here if you need them let me know and I can leave them at the front desk. I will call in the topiramate, thanks Dr. Mervyn Skeeters

## 2020-10-25 NOTE — Telephone Encounter (Signed)
Pt called, pharmacy still do not have a prescription for Topamax.  Would like a call from the nurse.

## 2020-10-26 ENCOUNTER — Other Ambulatory Visit (HOSPITAL_COMMUNITY): Payer: Self-pay

## 2020-10-26 ENCOUNTER — Other Ambulatory Visit: Payer: Self-pay | Admitting: Neurology

## 2020-10-26 MED ORDER — TOPIRAMATE 25 MG PO TABS
ORAL_TABLET | ORAL | 6 refills | Status: DC
  Filled 2020-10-26: qty 60, 30d supply, fill #0

## 2020-10-26 NOTE — Telephone Encounter (Signed)
LMVM for pt to return call also emailed.

## 2020-11-02 ENCOUNTER — Telehealth: Payer: Self-pay | Admitting: Neurology

## 2020-11-02 ENCOUNTER — Other Ambulatory Visit (HOSPITAL_COMMUNITY): Payer: Self-pay

## 2020-11-02 ENCOUNTER — Ambulatory Visit (INDEPENDENT_AMBULATORY_CARE_PROVIDER_SITE_OTHER): Payer: 59 | Admitting: Neurology

## 2020-11-02 ENCOUNTER — Telehealth: Payer: Self-pay | Admitting: *Deleted

## 2020-11-02 ENCOUNTER — Encounter: Payer: Self-pay | Admitting: *Deleted

## 2020-11-02 ENCOUNTER — Other Ambulatory Visit: Payer: Self-pay

## 2020-11-02 DIAGNOSIS — G43711 Chronic migraine without aura, intractable, with status migrainosus: Secondary | ICD-10-CM

## 2020-11-02 DIAGNOSIS — G43831 Menstrual migraine, intractable, with status migrainosus: Secondary | ICD-10-CM

## 2020-11-02 DIAGNOSIS — R112 Nausea with vomiting, unspecified: Secondary | ICD-10-CM

## 2020-11-02 MED ORDER — FROVATRIPTAN SUCCINATE 2.5 MG PO TABS
ORAL_TABLET | ORAL | 11 refills | Status: DC
  Filled 2020-11-02 – 2021-10-20 (×2): qty 9, 30d supply, fill #0

## 2020-11-02 MED ORDER — UBRELVY 100 MG PO TABS: 100.0000 mg | ORAL_TABLET | ORAL | 11 refills | Status: DC | PRN

## 2020-11-02 MED ORDER — ONDANSETRON HCL 4 MG PO TABS
4.0000 mg | ORAL_TABLET | Freq: Three times a day (TID) | ORAL | 11 refills | Status: DC | PRN
  Filled 2020-11-02 – 2020-11-04 (×2): qty 30, 5d supply, fill #0
  Filled 2020-12-07: qty 30, 5d supply, fill #1
  Filled 2021-02-01: qty 30, 5d supply, fill #2
  Filled 2021-05-29: qty 30, 5d supply, fill #3
  Filled 2021-07-12: qty 30, 5d supply, fill #4

## 2020-11-02 NOTE — Telephone Encounter (Signed)
Qulipta appeal signed and faxed to [employer]. Received a receipt of confirmation.

## 2020-11-02 NOTE — Progress Notes (Signed)
Consent Form Botulism Toxin Injection For Chronic Migraine  11/02/2020: stable, still doing excellent. Episodic migraines while on botox, will give Qulipta to help further. Will discontinue ajovy*. Will continue botox and qulipta. Ubrelvy prn.    Notes: Doesn't like nurtec as much*. Discussed elyxyb and Trudessa and Reyvow as well. Will refill frova and she had a side effect to topiramate. Already tried multiple triptans (sumatriptan, maxalt, almotriptan, eletriptan). Also discussed toradol injections as a possibility.  Meds ordered this encounter  Medications   ondansetron (ZOFRAN) 4 MG tablet    Sig: Take 1-2 tablets (4-8 mg total) by mouth every 8 (eight) hours as needed for nausea or vomiting.    Dispense:  30 tablet    Refill:  11   frovatriptan (FROVA) 2.5 MG tablet    Sig: TAKE 1 TABLET BY MOUTH AS NEEDED FOR MIGRAINE. IF RECURS, MAY REPEAT AFTER 2 HOURS. MAX 3 TABS IN 24 HOURS    Dispense:  9 tablet    Refill:  11   Ubrogepant (UBRELVY) 100 MG TABS    Sig: Take 100 mg by mouth as needed (may repeat in 2 hours. Max 2/24 hours.).    Dispense:  16 tablet    Refill:  11    Patient can use a savings card regardless of insurance denial or approval.tried eletriptan, rizatriptan,sumatriptan,zolmitriptan,almotriptan      07/26/2020: Patient is doing excellent! She now has4-6 migraine days a month, technically episodic migraines, adding on Qulipta for better migraine control in episodic migraine.  Doing excellent, > 80% improvement in migraine frequency. +5u each orb oculi for pain behind the eyes. No masseters. +15u each extra bilaterally cervical paraspinals and traps (worse on her right). Loves qulipta and Ajovy. Together she is in the best place she has ever been with migraine management.  Meds ordered this encounter  Medications   ondansetron (ZOFRAN) 4 MG tablet    Sig: Take 1-2 tablets (4-8 mg total) by mouth every 8 (eight) hours as needed for nausea or vomiting.     Dispense:  30 tablet    Refill:  11   frovatriptan (FROVA) 2.5 MG tablet    Sig: TAKE 1 TABLET BY MOUTH AS NEEDED FOR MIGRAINE. IF RECURS, MAY REPEAT AFTER 2 HOURS. MAX 3 TABS IN 24 HOURS    Dispense:  9 tablet    Refill:  11   Ubrogepant (UBRELVY) 100 MG TABS    Sig: Take 100 mg by mouth as needed (may repeat in 2 hours. Max 2/24 hours.).    Dispense:  16 tablet    Refill:  11    Patient can use a savings card regardless of insurance denial or approval.tried eletriptan, rizatriptan,sumatriptan,zolmitriptan,almotriptan     Reviewed orally with patient, additionally signature is on file:  Botulism toxin has been approved by the Federal drug administration for treatment of chronic migraine. Botulism toxin does not cure chronic migraine and it may not be effective in some patients.  The administration of botulism toxin is accomplished by injecting a small amount of toxin into the muscles of the neck and head. Dosage must be titrated for each individual. Any benefits resulting from botulism toxin tend to wear off after 3 months with a repeat injection required if benefit is to be maintained. Injections are usually done every 3-4 months with maximum effect peak achieved by about 2 or 3 weeks. Botulism toxin is expensive and you should be sure of what costs you will incur resulting from the injection.  The side effects of botulism toxin use for chronic migraine may include:   -Transient, and usually mild, facial weakness with facial injections  -Transient, and usually mild, head or neck weakness with head/neck injections  -Reduction or loss of forehead facial animation due to forehead muscle weakness  -Eyelid drooping  -Dry eye  -Pain at the site of injection or bruising at the site of injection  -Double vision  -Potential unknown long term risks  Contraindications: You should not have Botox if you are pregnant, nursing, allergic to albumin, have an infection, skin condition, or muscle  weakness at the site of the injection, or have myasthenia gravis, Lambert-Eaton syndrome, or ALS.  It is also possible that as with any injection, there may be an allergic reaction or no effect from the medication. Reduced effectiveness after repeated injections is sometimes seen and rarely infection at the injection site may occur. All care will be taken to prevent these side effects. If therapy is given over a long time, atrophy and wasting in the muscle injected may occur. Occasionally the patient's become refractory to treatment because they develop antibodies to the toxin. In this event, therapy needs to be modified.  I have read the above information and consent to the administration of botulism toxin.    BOTOX PROCEDURE NOTE FOR MIGRAINE HEADACHE    Contraindications and precautions discussed with patient(above). Aseptic procedure was observed and patient tolerated procedure. Procedure performed by Dr. Artemio Aly  The condition has existed for more than 6 months, and pt does not have a diagnosis of ALS, Myasthenia Gravis or Lambert-Eaton Syndrome.  Risks and benefits of injections discussed and pt agrees to proceed with the procedure.  Written consent obtained  These injections are medically necessary. Pt  receives good benefits from these injections. These injections do not cause sedations or hallucinations which the oral therapies may cause.  Description of procedure:  The patient was placed in a sitting position. The standard protocol was used for Botox as follows, with 5 units of Botox injected at each site:   -Procerus muscle, midline injection  -Corrugator muscle, bilateral injection  -Frontalis muscle, bilateral injection, with 2 sites each side, medial injection was performed in the upper one third of the frontalis muscle, in the region vertical from the medial inferior edge of the superior orbital rim. The lateral injection was again in the upper one third of the forehead  vertically above the lateral limbus of the cornea, 1.5 cm lateral to the medial injection site.  -Temporalis muscle injection, 4 sites, bilaterally. The first injection was 3 cm above the tragus of the ear, second injection site was 1.5 cm to 3 cm up from the first injection site in line with the tragus of the ear. The third injection site was 1.5-3 cm forward between the first 2 injection sites. The fourth injection site was 1.5 cm posterior to the second injection site.   -Occipitalis muscle injection, 3 sites, bilaterally. The first injection was done one half way between the occipital protuberance and the tip of the mastoid process behind the ear. The second injection site was done lateral and superior to the first, 1 fingerbreadth from the first injection. The third injection site was 1 fingerbreadth superiorly and medially from the first injection site.  -Cervical paraspinal muscle injection, 2 sites, bilateral knee first injection site was 1 cm from the midline of the cervical spine, 3 cm inferior to the lower border of the occipital protuberance. The second injection site was  1.5 cm superiorly and laterally to the first injection site.  -Trapezius muscle injection was performed at 3 sites, bilaterally. The first injection site was in the upper trapezius muscle halfway between the inflection point of the neck, and the acromion. The second injection site was one half way between the acromion and the first injection site. The third injection was done between the first injection site and the inflection point of the neck.   Will return for repeat injection in 3 months.   200 units of Botox was used, any Botox not injected was wasted. The patient tolerated the procedure well, there were no complications of the above procedure.

## 2020-11-02 NOTE — Progress Notes (Signed)
Botox- 200 units x 1 vial Lot: [id number] Expiration: 03/2023 NDC: 0023-3921-02  Bacteriostatic 0.9% Sodium Chloride- 4mL total Lot: [id number] Expiration: 03/20/2022 NDC: 0409-1966-02  Dx: G43.711 B/B  

## 2020-11-02 NOTE — Telephone Encounter (Signed)
Patient has a Botox appointment today. She has [employer], now requiring PA for Botox. I spoke with Misty Stanley @ [employer] [phone]) to create a case. Misty Stanley states PA for [phone] is pending for clinical notes. I faxed notes to [phone]. Reference #[id number].

## 2020-11-03 ENCOUNTER — Other Ambulatory Visit (HOSPITAL_COMMUNITY): Payer: Self-pay

## 2020-11-03 NOTE — Telephone Encounter (Signed)
We received a fax from [employer] stating the appeal has been received and we will be notified no later than 15 days from the receipt of the appeal.  Appeal reference number: [id number]

## 2020-11-04 ENCOUNTER — Other Ambulatory Visit (HOSPITAL_COMMUNITY): Payer: Self-pay

## 2020-11-09 DIAGNOSIS — F411 Generalized anxiety disorder: Secondary | ICD-10-CM | POA: Diagnosis not present

## 2020-11-10 NOTE — Telephone Encounter (Signed)
Received approval from [facility]. PA [phone] (11/02/20- 05/05/21).

## 2020-11-19 ENCOUNTER — Other Ambulatory Visit (HOSPITAL_COMMUNITY): Payer: Self-pay

## 2020-11-19 MED ORDER — CARESTART COVID-19 HOME TEST VI KIT
PACK | 0 refills | Status: DC
  Filled 2020-11-19: qty 4, 4d supply, fill #0

## 2020-11-24 ENCOUNTER — Telehealth: Payer: Self-pay | Admitting: *Deleted

## 2020-11-24 MED ORDER — TRUDHESA 0.725 MG/ACT NA AERS: 1.0000 | INHALATION_SPRAY | Freq: Once | NASAL | 0 refills | Status: AC | PRN

## 2020-11-24 NOTE — Telephone Encounter (Signed)
Per Dr Lucia Gaskins, sample order placed for two Trudhesa nasal spray kits. Printed sample order that has instructions for patient. Samples ready for pickup. Patient notified.

## 2020-11-29 DIAGNOSIS — Z760 Encounter for issue of repeat prescription: Secondary | ICD-10-CM | POA: Diagnosis not present

## 2020-11-30 DIAGNOSIS — F411 Generalized anxiety disorder: Secondary | ICD-10-CM | POA: Diagnosis not present

## 2020-12-07 ENCOUNTER — Other Ambulatory Visit (HOSPITAL_COMMUNITY): Payer: Self-pay

## 2020-12-07 MED FILL — Norethindrone Ace & Ethinyl Estradiol Tab 1 MG-20 MCG: ORAL | 84 days supply | Qty: 84 | Fill #2 | Status: AC

## 2020-12-07 MED FILL — Prochlorperazine Maleate Tab 10 MG (Base Equivalent): ORAL | 7 days supply | Qty: 30 | Fill #1 | Status: AC

## 2020-12-13 ENCOUNTER — Other Ambulatory Visit (HOSPITAL_COMMUNITY): Payer: Self-pay

## 2020-12-21 DIAGNOSIS — F411 Generalized anxiety disorder: Secondary | ICD-10-CM | POA: Diagnosis not present

## 2021-01-03 ENCOUNTER — Other Ambulatory Visit (HOSPITAL_COMMUNITY): Payer: Self-pay

## 2021-01-18 DIAGNOSIS — F411 Generalized anxiety disorder: Secondary | ICD-10-CM | POA: Diagnosis not present

## 2021-01-20 ENCOUNTER — Other Ambulatory Visit (HOSPITAL_COMMUNITY): Payer: Self-pay

## 2021-01-20 NOTE — Telephone Encounter (Signed)
Received a fax from [place] complete stating that the insurance specialist has completed her review of the patient's insurance benefits and the patient has Meds.  They have advised a prescription be sent to patient's preferred retail pharmacy.  Below is a copy of the savings card:  Co-pay card processor: [employer] ID: [id number] Group: [id number] BIN: [id number] PCN: [id number] SUF: [id number] Card administrator phone: [phone]

## 2021-02-01 ENCOUNTER — Other Ambulatory Visit: Payer: Self-pay | Admitting: Neurology

## 2021-02-01 ENCOUNTER — Other Ambulatory Visit (HOSPITAL_COMMUNITY): Payer: Self-pay

## 2021-02-01 DIAGNOSIS — G43711 Chronic migraine without aura, intractable, with status migrainosus: Secondary | ICD-10-CM

## 2021-02-02 ENCOUNTER — Other Ambulatory Visit (HOSPITAL_COMMUNITY): Payer: Self-pay

## 2021-02-02 MED ORDER — PROCHLORPERAZINE MALEATE 10 MG PO TABS
ORAL_TABLET | Freq: Four times a day (QID) | ORAL | 11 refills | Status: DC | PRN
  Filled 2021-02-02: qty 30, 7d supply, fill #0
  Filled 2021-07-12: qty 30, 7d supply, fill #1

## 2021-02-07 ENCOUNTER — Ambulatory Visit (INDEPENDENT_AMBULATORY_CARE_PROVIDER_SITE_OTHER): Payer: 59 | Admitting: Neurology

## 2021-02-07 DIAGNOSIS — G43711 Chronic migraine without aura, intractable, with status migrainosus: Secondary | ICD-10-CM | POA: Diagnosis not present

## 2021-02-07 NOTE — Progress Notes (Signed)
Botox- 100 units x 2 vials Lot: [id number] Expiration: 04/2023 NDC: 0023-1145-01  Bacteriostatic 0.9% Sodium Chloride- 4mL total Lot: [id number] Expiration: 03/20/2022 NDC: 0409-1966-02  Dx: G43.711 B/B   

## 2021-02-07 NOTE — Progress Notes (Addendum)
Consent Form Botulism Toxin Injection For Chronic Migraine  11/21/2o22: stable, still doing excellent. >80% improvement reduced to 4 headache/imigraine days a month. Episodic migraines while on botox, gave Qulipta to help further. Has not had ajovy in months and doing phenomenal. . Will continue botox and qulipta. Ubrelvy prn. She does not clench.    Notes: Doesn't like nurtec as much*. Discussed elyxyb and Trudessa and Reyvow as well. Will refill frova and she had a side effect to topiramate. Already tried multiple triptans (sumatriptan, maxalt, almotriptan, eletriptan). Also discussed toradol injections as a possibility.     07/26/2020: Patient is doing excellent! She now has4-6 migraine/total headache days a month, technically episodic migraines, adding on Qulipta for better migraine control in episodic migraine.  Doing excellent, > 80% improvement in migraine frequency. Loves qulipta and Ajovy. Together she is in the best place she has ever been with migraine management.  No orders of the defined types were placed in this encounter.    Reviewed orally with patient, additionally signature is on file:  Botulism toxin has been approved by the Federal drug administration for treatment of chronic migraine. Botulism toxin does not cure chronic migraine and it may not be effective in some patients.  The administration of botulism toxin is accomplished by injecting a small amount of toxin into the muscles of the neck and head. Dosage must be titrated for each individual. Any benefits resulting from botulism toxin tend to wear off after 3 months with a repeat injection required if benefit is to be maintained. Injections are usually done every 3-4 months with maximum effect peak achieved by about 2 or 3 weeks. Botulism toxin is expensive and you should be sure of what costs you will incur resulting from the injection.  The side effects of botulism toxin use for chronic migraine may  include:   -Transient, and usually mild, facial weakness with facial injections  -Transient, and usually mild, head or neck weakness with head/neck injections  -Reduction or loss of forehead facial animation due to forehead muscle weakness  -Eyelid drooping  -Dry eye  -Pain at the site of injection or bruising at the site of injection  -Double vision  -Potential unknown long term risks  Contraindications: You should not have Botox if you are pregnant, nursing, allergic to albumin, have an infection, skin condition, or muscle weakness at the site of the injection, or have myasthenia gravis, Lambert-Eaton syndrome, or ALS.  It is also possible that as with any injection, there may be an allergic reaction or no effect from the medication. Reduced effectiveness after repeated injections is sometimes seen and rarely infection at the injection site may occur. All care will be taken to prevent these side effects. If therapy is given over a long time, atrophy and wasting in the muscle injected may occur. Occasionally the patient's become refractory to treatment because they develop antibodies to the toxin. In this event, therapy needs to be modified.  I have read the above information and consent to the administration of botulism toxin.    BOTOX PROCEDURE NOTE FOR MIGRAINE HEADACHE    Contraindications and precautions discussed with patient(above). Aseptic procedure was observed and patient tolerated procedure. Procedure performed by Dr. Artemio Aly  The condition has existed for more than 6 months, and pt does not have a diagnosis of ALS, Myasthenia Gravis or Lambert-Eaton Syndrome.  Risks and benefits of injections discussed and pt agrees to proceed with the procedure.  Written consent obtained  These injections  are medically necessary. Pt  receives good benefits from these injections. These injections do not cause sedations or hallucinations which the oral therapies may cause.  Description of  procedure:  The patient was placed in a sitting position. The standard protocol was used for Botox as follows, with 5 units of Botox injected at each site:   -Procerus muscle, midline injection  -Corrugator muscle, bilateral injection  -Frontalis muscle, bilateral injection, with 2 sites each side, medial injection was performed in the upper one third of the frontalis muscle, in the region vertical from the medial inferior edge of the superior orbital rim. The lateral injection was again in the upper one third of the forehead vertically above the lateral limbus of the cornea, 1.5 cm lateral to the medial injection site.  -Temporalis muscle injection, 4 sites, bilaterally. The first injection was 3 cm above the tragus of the ear, second injection site was 1.5 cm to 3 cm up from the first injection site in line with the tragus of the ear. The third injection site was 1.5-3 cm forward between the first 2 injection sites. The fourth injection site was 1.5 cm posterior to the second injection site.   -Occipitalis muscle injection, 3 sites, bilaterally. The first injection was done one half way between the occipital protuberance and the tip of the mastoid process behind the ear. The second injection site was done lateral and superior to the first, 1 fingerbreadth from the first injection. The third injection site was 1 fingerbreadth superiorly and medially from the first injection site.  -Cervical paraspinal muscle injection, 2 sites, bilateral knee first injection site was 1 cm from the midline of the cervical spine, 3 cm inferior to the lower border of the occipital protuberance. The second injection site was 1.5 cm superiorly and laterally to the first injection site.  -Trapezius muscle injection was performed at 3 sites, bilaterally. The first injection site was in the upper trapezius muscle halfway between the inflection point of the neck, and the acromion. The second injection site was one half way  between the acromion and the first injection site. The third injection was done between the first injection site and the inflection point of the neck.   Will return for repeat injection in 3 months.   155 units of Botox was used, 45u Botox not injected was wasted. The patient tolerated the procedure well, there were no complications of the above procedure.

## 2021-02-08 ENCOUNTER — Ambulatory Visit: Payer: 59 | Admitting: Neurology

## 2021-02-15 DIAGNOSIS — F411 Generalized anxiety disorder: Secondary | ICD-10-CM | POA: Diagnosis not present

## 2021-02-22 ENCOUNTER — Telehealth: Payer: Self-pay | Admitting: *Deleted

## 2021-02-22 NOTE — Telephone Encounter (Signed)
The request has been approved. The authorization is effective for a maximum of 12 fills from 02/22/2021 to 02/21/2022, as long as the member is enrolled in their current health plan. The request was approved with a quantity restriction. This has been approved for a quantity limit of 16 with a day supply limit of 30. A written notification letter will follow with additional details.

## 2021-02-22 NOTE — Telephone Encounter (Signed)
Completed Bernita Raisin PA on Cover My Meds. Key: [id number]. Awaiting determination from [employer].

## 2021-02-28 MED FILL — Norethindrone Ace & Ethinyl Estradiol Tab 1 MG-20 MCG: ORAL | 84 days supply | Qty: 84 | Fill #3 | Status: AC

## 2021-03-01 ENCOUNTER — Other Ambulatory Visit (HOSPITAL_COMMUNITY): Payer: Self-pay

## 2021-03-04 ENCOUNTER — Other Ambulatory Visit (HOSPITAL_COMMUNITY): Payer: Self-pay

## 2021-03-29 DIAGNOSIS — F411 Generalized anxiety disorder: Secondary | ICD-10-CM | POA: Diagnosis not present

## 2021-03-30 ENCOUNTER — Other Ambulatory Visit (HOSPITAL_COMMUNITY): Payer: Self-pay

## 2021-03-31 ENCOUNTER — Other Ambulatory Visit (HOSPITAL_COMMUNITY): Payer: Self-pay

## 2021-03-31 MED ORDER — CARESTART COVID-19 HOME TEST VI KIT
PACK | 0 refills | Status: DC
  Filled 2021-03-31: qty 4, 4d supply, fill #0

## 2021-04-05 DIAGNOSIS — R0981 Nasal congestion: Secondary | ICD-10-CM | POA: Diagnosis not present

## 2021-04-05 DIAGNOSIS — Z20822 Contact with and (suspected) exposure to covid-19: Secondary | ICD-10-CM | POA: Diagnosis not present

## 2021-04-05 DIAGNOSIS — J029 Acute pharyngitis, unspecified: Secondary | ICD-10-CM | POA: Diagnosis not present

## 2021-04-12 DIAGNOSIS — F411 Generalized anxiety disorder: Secondary | ICD-10-CM | POA: Diagnosis not present

## 2021-04-14 ENCOUNTER — Other Ambulatory Visit (HOSPITAL_COMMUNITY): Payer: Self-pay

## 2021-04-14 DIAGNOSIS — F3342 Major depressive disorder, recurrent, in full remission: Secondary | ICD-10-CM | POA: Diagnosis not present

## 2021-04-14 DIAGNOSIS — N938 Other specified abnormal uterine and vaginal bleeding: Secondary | ICD-10-CM | POA: Diagnosis not present

## 2021-04-14 DIAGNOSIS — E039 Hypothyroidism, unspecified: Secondary | ICD-10-CM | POA: Diagnosis not present

## 2021-04-14 DIAGNOSIS — Z13220 Encounter for screening for lipoid disorders: Secondary | ICD-10-CM | POA: Diagnosis not present

## 2021-04-14 DIAGNOSIS — Z0000 Encounter for general adult medical examination without abnormal findings: Secondary | ICD-10-CM | POA: Diagnosis not present

## 2021-04-14 DIAGNOSIS — J309 Allergic rhinitis, unspecified: Secondary | ICD-10-CM | POA: Diagnosis not present

## 2021-04-14 DIAGNOSIS — F411 Generalized anxiety disorder: Secondary | ICD-10-CM | POA: Diagnosis not present

## 2021-04-14 DIAGNOSIS — E538 Deficiency of other specified B group vitamins: Secondary | ICD-10-CM | POA: Diagnosis not present

## 2021-04-14 DIAGNOSIS — G43909 Migraine, unspecified, not intractable, without status migrainosus: Secondary | ICD-10-CM | POA: Diagnosis not present

## 2021-04-14 MED ORDER — LORAZEPAM 0.5 MG PO TABS
ORAL_TABLET | ORAL | 1 refills | Status: DC
  Filled 2021-04-14: qty 30, 10d supply, fill #0
  Filled 2021-07-12: qty 30, 10d supply, fill #1

## 2021-04-14 MED ORDER — ESCITALOPRAM OXALATE 10 MG PO TABS
ORAL_TABLET | ORAL | 1 refills | Status: DC
  Filled 2021-04-14 – 2021-05-29 (×2): qty 90, 90d supply, fill #0
  Filled 2021-08-18: qty 90, 90d supply, fill #1

## 2021-04-14 MED ORDER — BUPROPION HCL ER (XL) 150 MG PO TB24
ORAL_TABLET | ORAL | 1 refills | Status: DC
  Filled 2021-04-14: qty 90, 90d supply, fill #0
  Filled 2021-07-12: qty 90, 90d supply, fill #1

## 2021-04-14 MED ORDER — TRIAMCINOLONE ACETONIDE 55 MCG/ACT NA AERO
2.0000 | INHALATION_SPRAY | Freq: Every day | NASAL | 3 refills | Status: AC
Start: 2021-04-14 — End: ?
  Filled 2021-11-21: qty 50.7, 90d supply, fill #0
  Filled 2021-12-21: qty 3, fill #0
  Filled 2022-01-16: qty 3, 30d supply, fill #0
  Filled 2022-02-14: qty 50.7, 90d supply, fill #0

## 2021-04-14 MED ORDER — NORETHINDRONE ACET-ETHINYL EST 1-20 MG-MCG PO TABS
ORAL_TABLET | ORAL | 4 refills | Status: DC
  Filled 2021-04-14 – 2021-05-29 (×2): qty 84, 84d supply, fill #0
  Filled 2021-08-09: qty 84, 84d supply, fill #1
  Filled 2021-11-21: qty 84, 84d supply, fill #2
  Filled 2022-02-14: qty 84, 84d supply, fill #3

## 2021-04-26 DIAGNOSIS — F411 Generalized anxiety disorder: Secondary | ICD-10-CM | POA: Diagnosis not present

## 2021-05-03 ENCOUNTER — Ambulatory Visit: Payer: 59 | Admitting: Neurology

## 2021-05-03 ENCOUNTER — Telehealth: Payer: Self-pay | Admitting: Neurology

## 2021-05-03 NOTE — Telephone Encounter (Signed)
Patient has Botox appoint on 05/17/2021. She has [employer] which requires PA for Botox. I faxed notes to [phone]. Reference #[id number].

## 2021-05-17 ENCOUNTER — Ambulatory Visit: Payer: 59 | Admitting: Neurology

## 2021-05-30 ENCOUNTER — Encounter: Payer: Self-pay | Admitting: Neurology

## 2021-05-30 ENCOUNTER — Other Ambulatory Visit (HOSPITAL_COMMUNITY): Payer: Self-pay

## 2021-05-30 MED ORDER — QULIPTA 60 MG PO TABS
60.0000 mg | ORAL_TABLET | Freq: Every day | ORAL | 3 refills | Status: DC
  Filled 2021-05-30: qty 30, 30d supply, fill #0
  Filled 2021-07-12: qty 30, 30d supply, fill #1
  Filled 2021-08-09: qty 30, 30d supply, fill #2

## 2021-06-03 DIAGNOSIS — Z20822 Contact with and (suspected) exposure to covid-19: Secondary | ICD-10-CM | POA: Diagnosis not present

## 2021-06-06 ENCOUNTER — Other Ambulatory Visit (HOSPITAL_COMMUNITY): Payer: Self-pay

## 2021-06-06 MED ORDER — CARESTART COVID-19 HOME TEST VI KIT
PACK | 1 refills | Status: DC
  Filled 2021-06-06: qty 2, 4d supply, fill #0
  Filled 2021-10-20: qty 2, 4d supply, fill #1

## 2021-06-06 NOTE — Telephone Encounter (Signed)
Approval letter received from [employer] then faxed to [facility]. Received a receipt of confirmation. ? ?

## 2021-06-06 NOTE — Telephone Encounter (Signed)
Qulipta PA received. Completed this on Cover My Meds. Key: [id number]. Awaiting determination from [employer].  ? ?Approved today ?The request has been approved. The authorization is effective for a maximum of 6 fills from 06/06/2021 to 12/06/2021, as long as the member is enrolled in their current health plan. The request was approved with a quantity restriction. This has been approved for a max daily dosage of 1. A written notification letter will follow with additional details. ?

## 2021-06-07 DIAGNOSIS — F411 Generalized anxiety disorder: Secondary | ICD-10-CM | POA: Diagnosis not present

## 2021-06-08 ENCOUNTER — Other Ambulatory Visit (HOSPITAL_COMMUNITY): Payer: Self-pay

## 2021-06-13 ENCOUNTER — Ambulatory Visit (INDEPENDENT_AMBULATORY_CARE_PROVIDER_SITE_OTHER): Payer: 59 | Admitting: Neurology

## 2021-06-13 ENCOUNTER — Other Ambulatory Visit: Payer: Self-pay

## 2021-06-13 DIAGNOSIS — G43711 Chronic migraine without aura, intractable, with status migrainosus: Secondary | ICD-10-CM

## 2021-06-13 NOTE — Progress Notes (Signed)
Botox consent signed ?Botox- 200 units x 1 vial ?Lot: [id number] ?Expiration: 11/2023 ?NDC: [phone] ? ?Bacteriostatic 0.9% Sodium Chloride- 8mL total ?Lot: [id number] ?Expiration: 10/19/2022 ?NDC: 0093-8182-99 ? ?Dx: B71.696 ?B/B ? ?

## 2021-06-13 NOTE — Progress Notes (Signed)
? ? ?Consent Form ?Botulism Toxin Injection For Chronic Migraine ? ?06/13/2021: Stable, still doing great; Patient is doing excellent! She now has4-6 migraine/total headache days a month ?11/21/2o22: stable, still doing excellent. >80% improvement reduced to 4 headache/imigraine days a month. Episodic migraines while on botox, gave Qulipta to help further. Has not had ajovy in months and doing phenomenal. . Will continue botox and qulipta. Ubrelvy prn. She does not clench. Botox, ubrelvy,qulipta all approved 02/2021.  ? ? ?Notes: Doesn't like nurtec as much*. Discussed elyxyb and Trudessa and Reyvow as well. Will refill frova and she had a side effect to topiramate. Already tried multiple triptans (sumatriptan, maxalt, almotriptan, eletriptan). Also discussed toradol injections as a possibility. ? ? ? ? ?07/26/2020: Patient is doing excellent! She now has4-6 migraine/total headache days a month, technically episodic migraines, adding on Qulipta for better migraine control in episodic migraine.  Doing excellent, > 80% improvement in migraine frequency. Loves qulipta and Ajovy. Together she is in the best place she has ever been with migraine management. ? ?No orders of the defined types were placed in this encounter. ? ? ? ? ?Reviewed orally with patient, additionally signature is on file: ? ?Botulism toxin has been approved by the Federal drug administration for treatment of chronic migraine. Botulism toxin does not cure chronic migraine and it may not be effective in some patients. ? ?The administration of botulism toxin is accomplished by injecting a small amount of toxin into the muscles of the neck and head. Dosage must be titrated for each individual. Any benefits resulting from botulism toxin tend to wear off after 3 months with a repeat injection required if benefit is to be maintained. Injections are usually done every 3-4 months with maximum effect peak achieved by about 2 or 3 weeks. Botulism toxin is  expensive and you should be sure of what costs you will incur resulting from the injection. ? ?The side effects of botulism toxin use for chronic migraine may include: ? ? -Transient, and usually mild, facial weakness with facial injections ? -Transient, and usually mild, head or neck weakness with head/neck injections ? -Reduction or loss of forehead facial animation due to forehead muscle weakness ? -Eyelid drooping ? -Dry eye ? -Pain at the site of injection or bruising at the site of injection ? -Double vision ? -Potential unknown long term risks ? ?Contraindications: You should not have Botox if you are pregnant, nursing, allergic to albumin, have an infection, skin condition, or muscle weakness at the site of the injection, or have myasthenia gravis, Lambert-Eaton syndrome, or ALS. ? ?It is also possible that as with any injection, there may be an allergic reaction or no effect from the medication. Reduced effectiveness after repeated injections is sometimes seen and rarely infection at the injection site may occur. All care will be taken to prevent these side effects. If therapy is given over a long time, atrophy and wasting in the muscle injected may occur. Occasionally the patient's become refractory to treatment because they develop antibodies to the toxin. In this event, therapy needs to be modified. ? ?I have read the above information and consent to the administration of botulism toxin. ? ? ? ?BOTOX PROCEDURE NOTE FOR MIGRAINE HEADACHE ? ? ? ?Contraindications and precautions discussed with patient(above). Aseptic procedure was observed and patient tolerated procedure. Procedure performed by Dr. Artemio Aly ? ?The condition has existed for more than 6 months, and pt does not have a diagnosis of ALS, Myasthenia Gravis or Lambert-Eaton  Syndrome.  Risks and benefits of injections discussed and pt agrees to proceed with the procedure.  Written consent obtained ? ?These injections are medically necessary. Pt   receives good benefits from these injections. These injections do not cause sedations or hallucinations which the oral therapies may cause. ? ?Description of procedure: ? ?The patient was placed in a sitting position. The standard protocol was used for Botox as follows, with 5 units of Botox injected at each site: ? ? ?-Procerus muscle, midline injection ? ?-Corrugator muscle, bilateral injection ? ?-Frontalis muscle, bilateral injection, with 2 sites each side, medial injection was performed in the upper one third of the frontalis muscle, in the region vertical from the medial inferior edge of the superior orbital rim. The lateral injection was again in the upper one third of the forehead vertically above the lateral limbus of the cornea, 1.5 cm lateral to the medial injection site. ? ?-Temporalis muscle injection, 4 sites, bilaterally. The first injection was 3 cm above the tragus of the ear, second injection site was 1.5 cm to 3 cm up from the first injection site in line with the tragus of the ear. The third injection site was 1.5-3 cm forward between the first 2 injection sites. The fourth injection site was 1.5 cm posterior to the second injection site.  ? ?-Occipitalis muscle injection, 3 sites, bilaterally. The first injection was done one half way between the occipital protuberance and the tip of the mastoid process behind the ear. The second injection site was done lateral and superior to the first, 1 fingerbreadth from the first injection. The third injection site was 1 fingerbreadth superiorly and medially from the first injection site. ? ?-Cervical paraspinal muscle injection, 2 sites, bilateral knee first injection site was 1 cm from the midline of the cervical spine, 3 cm inferior to the lower border of the occipital protuberance. The second injection site was 1.5 cm superiorly and laterally to the first injection site. ? ?-Trapezius muscle injection was performed at 3 sites, bilaterally. The first  injection site was in the upper trapezius muscle halfway between the inflection point of the neck, and the acromion. The second injection site was one half way between the acromion and the first injection site. The third injection was done between the first injection site and the inflection point of the neck. ? ? ?Will return for repeat injection in 3 months. ? ? ?155 units of Botox was used, 45u Botox not injected was wasted. The patient tolerated the procedure well, there were no complications of the above procedure. ? ? ? ?

## 2021-06-21 DIAGNOSIS — F411 Generalized anxiety disorder: Secondary | ICD-10-CM | POA: Diagnosis not present

## 2021-07-12 ENCOUNTER — Other Ambulatory Visit (HOSPITAL_COMMUNITY): Payer: Self-pay

## 2021-07-14 ENCOUNTER — Other Ambulatory Visit (HOSPITAL_COMMUNITY): Payer: Self-pay

## 2021-08-02 DIAGNOSIS — F411 Generalized anxiety disorder: Secondary | ICD-10-CM | POA: Diagnosis not present

## 2021-08-07 ENCOUNTER — Other Ambulatory Visit (HOSPITAL_COMMUNITY): Payer: Self-pay

## 2021-08-08 ENCOUNTER — Other Ambulatory Visit (HOSPITAL_COMMUNITY): Payer: Self-pay

## 2021-08-08 MED ORDER — LORAZEPAM 0.5 MG PO TABS
ORAL_TABLET | ORAL | 1 refills | Status: DC
  Filled 2021-08-08: qty 30, 10d supply, fill #0
  Filled 2021-10-20: qty 30, 10d supply, fill #1

## 2021-08-10 ENCOUNTER — Other Ambulatory Visit (HOSPITAL_COMMUNITY): Payer: Self-pay

## 2021-08-11 ENCOUNTER — Other Ambulatory Visit (HOSPITAL_COMMUNITY): Payer: Self-pay

## 2021-08-16 ENCOUNTER — Other Ambulatory Visit (HOSPITAL_COMMUNITY): Payer: Self-pay

## 2021-08-16 DIAGNOSIS — F411 Generalized anxiety disorder: Secondary | ICD-10-CM | POA: Diagnosis not present

## 2021-08-19 ENCOUNTER — Other Ambulatory Visit (HOSPITAL_COMMUNITY): Payer: Self-pay

## 2021-09-13 ENCOUNTER — Ambulatory Visit (INDEPENDENT_AMBULATORY_CARE_PROVIDER_SITE_OTHER): Payer: 59 | Admitting: Neurology

## 2021-09-13 DIAGNOSIS — G43009 Migraine without aura, not intractable, without status migrainosus: Secondary | ICD-10-CM | POA: Diagnosis not present

## 2021-09-13 DIAGNOSIS — F411 Generalized anxiety disorder: Secondary | ICD-10-CM | POA: Diagnosis not present

## 2021-09-13 MED ORDER — QULIPTA 60 MG PO TABS: 60.0000 mg | ORAL_TABLET | Freq: Every day | ORAL | 11 refills | Status: DC

## 2021-10-20 ENCOUNTER — Other Ambulatory Visit (HOSPITAL_COMMUNITY): Payer: Self-pay

## 2021-10-21 ENCOUNTER — Other Ambulatory Visit (HOSPITAL_COMMUNITY): Payer: Self-pay

## 2021-10-21 MED ORDER — BUPROPION HCL ER (XL) 150 MG PO TB24
150.0000 mg | ORAL_TABLET | Freq: Every morning | ORAL | 0 refills | Status: DC
  Filled 2021-10-21: qty 90, 90d supply, fill #0

## 2021-10-31 DIAGNOSIS — E039 Hypothyroidism, unspecified: Secondary | ICD-10-CM | POA: Diagnosis not present

## 2021-11-10 DIAGNOSIS — E039 Hypothyroidism, unspecified: Secondary | ICD-10-CM | POA: Diagnosis not present

## 2021-11-21 ENCOUNTER — Other Ambulatory Visit (HOSPITAL_COMMUNITY): Payer: Self-pay

## 2021-11-22 ENCOUNTER — Other Ambulatory Visit (HOSPITAL_COMMUNITY): Payer: Self-pay

## 2021-11-22 DIAGNOSIS — F411 Generalized anxiety disorder: Secondary | ICD-10-CM | POA: Diagnosis not present

## 2021-11-22 MED ORDER — LEVOTHYROXINE SODIUM 88 MCG PO TABS
88.0000 ug | ORAL_TABLET | Freq: Every day | ORAL | 4 refills | Status: AC
  Filled 2021-11-22: qty 90, 90d supply, fill #0
  Filled 2022-02-14: qty 30, 30d supply, fill #1
  Filled 2022-07-05: qty 30, 30d supply, fill #2

## 2021-11-23 ENCOUNTER — Other Ambulatory Visit (HOSPITAL_COMMUNITY): Payer: Self-pay

## 2021-11-23 MED ORDER — ESCITALOPRAM OXALATE 10 MG PO TABS
10.0000 mg | ORAL_TABLET | Freq: Every day | ORAL | 0 refills | Status: DC
  Filled 2021-11-23: qty 90, 90d supply, fill #0

## 2021-11-24 ENCOUNTER — Other Ambulatory Visit (HOSPITAL_COMMUNITY): Payer: Self-pay

## 2021-11-28 ENCOUNTER — Other Ambulatory Visit (HOSPITAL_COMMUNITY): Payer: Self-pay

## 2021-11-28 DIAGNOSIS — F3342 Major depressive disorder, recurrent, in full remission: Secondary | ICD-10-CM | POA: Diagnosis not present

## 2021-11-28 DIAGNOSIS — G43909 Migraine, unspecified, not intractable, without status migrainosus: Secondary | ICD-10-CM | POA: Diagnosis not present

## 2021-11-28 DIAGNOSIS — F411 Generalized anxiety disorder: Secondary | ICD-10-CM | POA: Diagnosis not present

## 2021-11-28 MED ORDER — LORAZEPAM 0.5 MG PO TABS
0.2500 mg | ORAL_TABLET | Freq: Three times a day (TID) | ORAL | 1 refills | Status: DC | PRN
  Filled 2021-11-28: qty 30, 10d supply, fill #0

## 2021-11-28 MED ORDER — ESCITALOPRAM OXALATE 10 MG PO TABS
10.0000 mg | ORAL_TABLET | Freq: Every day | ORAL | 1 refills | Status: AC
  Filled 2021-11-28 – 2022-02-14 (×2): qty 90, 90d supply, fill #0
  Filled 2022-08-16: qty 90, 90d supply, fill #1

## 2021-11-28 MED ORDER — BUPROPION HCL ER (XL) 150 MG PO TB24
150.0000 mg | ORAL_TABLET | Freq: Every morning | ORAL | 1 refills | Status: DC
  Filled 2021-11-28 – 2022-01-16 (×2): qty 90, 90d supply, fill #0
  Filled 2022-04-18: qty 90, 90d supply, fill #1

## 2021-12-01 ENCOUNTER — Other Ambulatory Visit (HOSPITAL_COMMUNITY): Payer: Self-pay

## 2021-12-01 ENCOUNTER — Other Ambulatory Visit: Payer: Self-pay | Admitting: Neurology

## 2021-12-01 DIAGNOSIS — G43831 Menstrual migraine, intractable, with status migrainosus: Secondary | ICD-10-CM

## 2021-12-01 DIAGNOSIS — G43711 Chronic migraine without aura, intractable, with status migrainosus: Secondary | ICD-10-CM

## 2021-12-01 NOTE — Telephone Encounter (Unsigned)
Pt requesting FROVA.  Insurance states needs another alternative.  I will do PA.

## 2021-12-05 MED ORDER — FROVATRIPTAN SUCCINATE 2.5 MG PO TABS
ORAL_TABLET | ORAL | 11 refills | Status: DC
  Filled 2021-12-05: qty 9, 28d supply, fill #0

## 2021-12-06 ENCOUNTER — Ambulatory Visit (INDEPENDENT_AMBULATORY_CARE_PROVIDER_SITE_OTHER): Payer: 59 | Admitting: Neurology

## 2021-12-06 ENCOUNTER — Other Ambulatory Visit (HOSPITAL_COMMUNITY): Payer: Self-pay

## 2021-12-06 DIAGNOSIS — G43009 Migraine without aura, not intractable, without status migrainosus: Secondary | ICD-10-CM | POA: Diagnosis not present

## 2021-12-06 MED ORDER — ONABOTULINUMTOXINA 200 UNITS IJ SOLR
155.0000 [IU] | Freq: Once | INTRAMUSCULAR | Status: AC
  Administered 2021-12-06: 155 [IU] via INTRAMUSCULAR

## 2021-12-06 NOTE — Progress Notes (Signed)
Botox- 200 units x 1 vial Lot: [id number] Expiration: 04/2024 NDC: 2694-8546-27  Bacteriostatic 0.9% Sodium Chloride- 4mL total Lot: [id number] Expiration: 10/19/2022 NDC: 0350-0938-18  Dx: E99.371 B/B

## 2021-12-06 NOTE — Progress Notes (Signed)
Consent Form Botulism Toxin Injection For Chronic Migraine 12/06/2021: stable ding great 09/13/2021: stable, doing great 06/13/2021: Stable, still doing great; Patient is doing excellent! She now has4-6 migraine/total headache days a month 11/21/2o22: stable, still doing excellent. >80% improvement reduced to 4 headache/imigraine days a month. Episodic migraines while on botox, gave Qulipta to help further. Has not had ajovy in months and doing phenomenal. . Will continue botox and qulipta. Ubrelvy prn. She does not clench. Botox, ubrelvy,qulipta all approved 02/2021.    Notes: Doesn't like nurtec as much*. Discussed elyxyb and Trudessa and Reyvow as well. Will refill frova and she had a side effect to topiramate. Already tried multiple triptans (sumatriptan, maxalt, almotriptan, eletriptan). Also discussed toradol injections as a possibility.     07/26/2020: Patient is doing excellent! She now has4-6 migraine/total headache days a month, technically episodic migraines, adding on Qulipta for better migraine control in episodic migraine.  Doing excellent, > 80% improvement in migraine frequency. Loves qulipta and Ajovy. Together she is in the best place she has ever been with migraine management.  Meds ordered this encounter  Medications   botulinum toxin Type A (BOTOX) injection 155 Units    Botox- 200 units x 1 vial Lot: [id number] Expiration: 04/2024 NDC: 5329-9242-68  Bacteriostatic 0.9% Sodium Chloride- 29mL total Lot: [id number] Expiration: 10/19/2022 NDC: 3419-6222-97  Dx: L89.211 B/B      Reviewed orally with patient, additionally signature is on file:  Botulism toxin has been approved by the Federal drug administration for treatment of chronic migraine. Botulism toxin does not cure chronic migraine and it may not be effective in some patients.  The administration of botulism toxin is accomplished by injecting a small amount of toxin into the muscles of the neck and head.  Dosage must be titrated for each individual. Any benefits resulting from botulism toxin tend to wear off after 3 months with a repeat injection required if benefit is to be maintained. Injections are usually done every 3-4 months with maximum effect peak achieved by about 2 or 3 weeks. Botulism toxin is expensive and you should be sure of what costs you will incur resulting from the injection.  The side effects of botulism toxin use for chronic migraine may include:   -Transient, and usually mild, facial weakness with facial injections  -Transient, and usually mild, head or neck weakness with head/neck injections  -Reduction or loss of forehead facial animation due to forehead muscle weakness  -Eyelid drooping  -Dry eye  -Pain at the site of injection or bruising at the site of injection  -Double vision  -Potential unknown long term risks  Contraindications: You should not have Botox if you are pregnant, nursing, allergic to albumin, have an infection, skin condition, or muscle weakness at the site of the injection, or have myasthenia gravis, Lambert-Eaton syndrome, or ALS.  It is also possible that as with any injection, there may be an allergic reaction or no effect from the medication. Reduced effectiveness after repeated injections is sometimes seen and rarely infection at the injection site may occur. All care will be taken to prevent these side effects. If therapy is given over a long time, atrophy and wasting in the muscle injected may occur. Occasionally the patient's become refractory to treatment because they develop antibodies to the toxin. In this event, therapy needs to be modified.  I have read the above information and consent to the administration of botulism toxin.    BOTOX PROCEDURE NOTE FOR MIGRAINE HEADACHE  Contraindications and precautions discussed with patient(above). Aseptic procedure was observed and patient tolerated procedure. Procedure performed by Dr. Georgia Dom  The condition has existed for more than 6 months, and pt does not have a diagnosis of ALS, Myasthenia Gravis or Lambert-Eaton Syndrome.  Risks and benefits of injections discussed and pt agrees to proceed with the procedure.  Written consent obtained  These injections are medically necessary. Pt  receives good benefits from these injections. These injections do not cause sedations or hallucinations which the oral therapies may cause.  Description of procedure:  The patient was placed in a sitting position. The standard protocol was used for Botox as follows, with 5 units of Botox injected at each site:   -Procerus muscle, midline injection  -Corrugator muscle, bilateral injection  -Frontalis muscle, bilateral injection, with 2 sites each side, medial injection was performed in the upper one third of the frontalis muscle, in the region vertical from the medial inferior edge of the superior orbital rim. The lateral injection was again in the upper one third of the forehead vertically above the lateral limbus of the cornea, 1.5 cm lateral to the medial injection site.  -Temporalis muscle injection, 4 sites, bilaterally. The first injection was 3 cm above the tragus of the ear, second injection site was 1.5 cm to 3 cm up from the first injection site in line with the tragus of the ear. The third injection site was 1.5-3 cm forward between the first 2 injection sites. The fourth injection site was 1.5 cm posterior to the second injection site.   -Occipitalis muscle injection, 3 sites, bilaterally. The first injection was done one half way between the occipital protuberance and the tip of the mastoid process behind the ear. The second injection site was done lateral and superior to the first, 1 fingerbreadth from the first injection. The third injection site was 1 fingerbreadth superiorly and medially from the first injection site.  -Cervical paraspinal muscle injection, 2 sites, bilateral knee  first injection site was 1 cm from the midline of the cervical spine, 3 cm inferior to the lower border of the occipital protuberance. The second injection site was 1.5 cm superiorly and laterally to the first injection site.  -Trapezius muscle injection was performed at 3 sites, bilaterally. The first injection site was in the upper trapezius muscle halfway between the inflection point of the neck, and the acromion. The second injection site was one half way between the acromion and the first injection site. The third injection was done between the first injection site and the inflection point of the neck.   Will return for repeat injection in 3 months.   155 units of Botox was used, 45u Botox not injected was wasted. The patient tolerated the procedure well, there were no complications of the above procedure.

## 2021-12-07 ENCOUNTER — Other Ambulatory Visit (HOSPITAL_COMMUNITY): Payer: Self-pay

## 2021-12-14 NOTE — Telephone Encounter (Signed)
Lenoria Chime PA completed on Cover My Meds. Key: [id number]. Awaiting determination from [employer].

## 2021-12-15 NOTE — Telephone Encounter (Signed)
Approved on September 27 The request has been approved. The authorization is effective for a maximum of 12 fills from 12/14/2021 to 12/14/2022, as long as the member is enrolled in their current health plan. The request was approved with a quantity restriction. This has been approved for a max daily dosage of 1. A written notification letter will follow with additional details.

## 2021-12-17 ENCOUNTER — Other Ambulatory Visit (HOSPITAL_COMMUNITY): Payer: Self-pay

## 2021-12-20 DIAGNOSIS — F411 Generalized anxiety disorder: Secondary | ICD-10-CM | POA: Diagnosis not present

## 2021-12-21 ENCOUNTER — Other Ambulatory Visit: Payer: Self-pay | Admitting: *Deleted

## 2021-12-21 ENCOUNTER — Other Ambulatory Visit (HOSPITAL_COMMUNITY): Payer: Self-pay

## 2021-12-21 MED ORDER — QULIPTA 60 MG PO TABS
60.0000 mg | ORAL_TABLET | Freq: Every day | ORAL | 11 refills | Status: DC
  Filled 2021-12-21: qty 30, 30d supply, fill #0
  Filled 2022-01-16: qty 30, 30d supply, fill #1
  Filled 2022-02-14: qty 30, 30d supply, fill #2
  Filled 2022-03-20: qty 30, 30d supply, fill #3
  Filled 2022-04-21: qty 30, 30d supply, fill #4
  Filled 2022-06-09: qty 30, 30d supply, fill #5
  Filled 2022-07-05: qty 30, 30d supply, fill #6
  Filled 2022-08-06: qty 30, 30d supply, fill #7
  Filled 2022-09-26: qty 30, 30d supply, fill #8
  Filled 2022-10-26: qty 30, 30d supply, fill #9
  Filled 2022-11-27: qty 30, 30d supply, fill #10

## 2021-12-22 ENCOUNTER — Other Ambulatory Visit (HOSPITAL_COMMUNITY): Payer: Self-pay

## 2022-01-03 DIAGNOSIS — F411 Generalized anxiety disorder: Secondary | ICD-10-CM | POA: Diagnosis not present

## 2022-01-16 ENCOUNTER — Other Ambulatory Visit (HOSPITAL_COMMUNITY): Payer: Self-pay

## 2022-01-17 DIAGNOSIS — F411 Generalized anxiety disorder: Secondary | ICD-10-CM | POA: Diagnosis not present

## 2022-01-19 ENCOUNTER — Other Ambulatory Visit (HOSPITAL_COMMUNITY): Payer: Self-pay

## 2022-01-23 ENCOUNTER — Other Ambulatory Visit (HOSPITAL_COMMUNITY): Payer: Self-pay

## 2022-01-24 ENCOUNTER — Other Ambulatory Visit (HOSPITAL_COMMUNITY): Payer: Self-pay

## 2022-02-14 ENCOUNTER — Other Ambulatory Visit (HOSPITAL_COMMUNITY): Payer: Self-pay

## 2022-02-14 DIAGNOSIS — F411 Generalized anxiety disorder: Secondary | ICD-10-CM | POA: Diagnosis not present

## 2022-02-15 ENCOUNTER — Other Ambulatory Visit (HOSPITAL_COMMUNITY): Payer: Self-pay

## 2022-02-16 ENCOUNTER — Other Ambulatory Visit (HOSPITAL_COMMUNITY): Payer: Self-pay

## 2022-02-20 ENCOUNTER — Other Ambulatory Visit (HOSPITAL_COMMUNITY): Payer: Self-pay

## 2022-02-22 NOTE — Telephone Encounter (Signed)
Renewal PA completed on CMM. Key: [id number]. Awaiting determination from [employer].

## 2022-02-22 NOTE — Telephone Encounter (Signed)
The request has been approved. The authorization is effective from 02/22/2022 to 02/22/2023, as long as the member is enrolled in their current health plan. The request was approved with a quantity restriction. This has been approved for a max daily dosage of 0.53. A written notification letter will follow with additional details.

## 2022-02-28 ENCOUNTER — Other Ambulatory Visit (HOSPITAL_COMMUNITY): Payer: Self-pay

## 2022-02-28 ENCOUNTER — Ambulatory Visit (INDEPENDENT_AMBULATORY_CARE_PROVIDER_SITE_OTHER): Payer: 59 | Admitting: Neurology

## 2022-02-28 DIAGNOSIS — R1112 Projectile vomiting: Secondary | ICD-10-CM

## 2022-02-28 DIAGNOSIS — G43711 Chronic migraine without aura, intractable, with status migrainosus: Secondary | ICD-10-CM | POA: Diagnosis not present

## 2022-02-28 DIAGNOSIS — G43831 Menstrual migraine, intractable, with status migrainosus: Secondary | ICD-10-CM

## 2022-02-28 MED ORDER — ONDANSETRON HCL 4 MG PO TABS
4.0000 mg | ORAL_TABLET | Freq: Three times a day (TID) | ORAL | 11 refills | Status: DC | PRN
  Filled 2022-02-28: qty 30, 5d supply, fill #0
  Filled 2022-07-05: qty 30, 5d supply, fill #1
  Filled 2022-09-26: qty 30, 5d supply, fill #2
  Filled 2022-10-29: qty 30, 5d supply, fill #3
  Filled 2023-02-22: qty 30, 5d supply, fill #4

## 2022-02-28 MED ORDER — ONABOTULINUMTOXINA 200 UNITS IJ SOLR
155.0000 [IU] | Freq: Once | INTRAMUSCULAR | Status: AC
  Administered 2022-02-28: 155 [IU] via INTRAMUSCULAR

## 2022-02-28 MED ORDER — PROCHLORPERAZINE MALEATE 10 MG PO TABS
10.0000 mg | ORAL_TABLET | Freq: Four times a day (QID) | ORAL | 11 refills | Status: DC | PRN
  Filled 2022-02-28: qty 30, 8d supply, fill #0
  Filled 2023-02-22: qty 30, 8d supply, fill #1

## 2022-02-28 NOTE — Progress Notes (Signed)
Botox- 200 units x 1 vial Lot: [id number] Expiration: 07/2024 NDC: 0023-3921-02  Bacteriostatic 0.9% Sodium Chloride- 4mL total Lot: [id number] Expiration: 11/19/2022 NDC: 0409-1966-02  Dx: G43.009  B/B 

## 2022-02-28 NOTE — Progress Notes (Signed)
Consent Form Botulism Toxin Injection For Chronic Migraine 02/28/2022: doing great, stable 12/06/2021: stable ding great 09/13/2021: stable, doing great 06/13/2021: Stable, still doing great; Patient is doing excellent! She now has4-6 migraine/total headache days a month 11/21/2o22: stable, still doing excellent. >80% improvement reduced to 4 headache/imigraine days a month. Episodic migraines while on botox, gave Qulipta to help further. Has not had ajovy in months and doing phenomenal. . Will continue botox and qulipta. Ubrelvy prn. She does not clench. Botox, ubrelvy,qulipta all approved 02/2021.    Notes: Doesn't like nurtec as much*. Discussed elyxyb and Trudessa and Reyvow as well. Will refill frova and she had a side effect to topiramate. Already tried multiple triptans (sumatriptan, maxalt, almotriptan, eletriptan). Also discussed toradol injections as a possibility.     07/26/2020: Patient is doing excellent! She now has4-6 migraine/total headache days a month, technically episodic migraines, adding on Qulipta for better migraine control in episodic migraine.  Doing excellent, > 80% improvement in migraine frequency. Loves qulipta and Ajovy. Together she is in the best place she has ever been with migraine management.  Meds ordered this encounter  Medications   botulinum toxin Type A (BOTOX) injection 155 Units    Botox- 200 units x 1 vial Lot: [id number] Expiration: 07/2024 NDC: 2952-8413-24  Bacteriostatic 0.9% Sodium Chloride- 30mL total Lot: [id number] Expiration: 11/19/2022 NDC: 2536-6440-34  Dx: G43.009   B/B      Reviewed orally with patient, additionally signature is on file:  Botulism toxin has been approved by the Federal drug administration for treatment of chronic migraine. Botulism toxin does not cure chronic migraine and it may not be effective in some patients.  The administration of botulism toxin is accomplished by injecting a small amount of toxin into the  muscles of the neck and head. Dosage must be titrated for each individual. Any benefits resulting from botulism toxin tend to wear off after 3 months with a repeat injection required if benefit is to be maintained. Injections are usually done every 3-4 months with maximum effect peak achieved by about 2 or 3 weeks. Botulism toxin is expensive and you should be sure of what costs you will incur resulting from the injection.  The side effects of botulism toxin use for chronic migraine may include:   -Transient, and usually mild, facial weakness with facial injections  -Transient, and usually mild, head or neck weakness with head/neck injections  -Reduction or loss of forehead facial animation due to forehead muscle weakness  -Eyelid drooping  -Dry eye  -Pain at the site of injection or bruising at the site of injection  -Double vision  -Potential unknown long term risks  Contraindications: You should not have Botox if you are pregnant, nursing, allergic to albumin, have an infection, skin condition, or muscle weakness at the site of the injection, or have myasthenia gravis, Lambert-Eaton syndrome, or ALS.  It is also possible that as with any injection, there may be an allergic reaction or no effect from the medication. Reduced effectiveness after repeated injections is sometimes seen and rarely infection at the injection site may occur. All care will be taken to prevent these side effects. If therapy is given over a long time, atrophy and wasting in the muscle injected may occur. Occasionally the patient's become refractory to treatment because they develop antibodies to the toxin. In this event, therapy needs to be modified.  I have read the above information and consent to the administration of botulism toxin.    BOTOX  PROCEDURE NOTE FOR MIGRAINE HEADACHE    Contraindications and precautions discussed with patient(above). Aseptic procedure was observed and patient tolerated procedure.  Procedure performed by Dr. Georgia Dom  The condition has existed for more than 6 months, and pt does not have a diagnosis of ALS, Myasthenia Gravis or Lambert-Eaton Syndrome.  Risks and benefits of injections discussed and pt agrees to proceed with the procedure.  Written consent obtained  These injections are medically necessary. Pt  receives good benefits from these injections. These injections do not cause sedations or hallucinations which the oral therapies may cause.  Description of procedure:  The patient was placed in a sitting position. The standard protocol was used for Botox as follows, with 5 units of Botox injected at each site:   -Procerus muscle, midline injection  -Corrugator muscle, bilateral injection  -Frontalis muscle, bilateral injection, with 2 sites each side, medial injection was performed in the upper one third of the frontalis muscle, in the region vertical from the medial inferior edge of the superior orbital rim. The lateral injection was again in the upper one third of the forehead vertically above the lateral limbus of the cornea, 1.5 cm lateral to the medial injection site.  -Temporalis muscle injection, 4 sites, bilaterally. The first injection was 3 cm above the tragus of the ear, second injection site was 1.5 cm to 3 cm up from the first injection site in line with the tragus of the ear. The third injection site was 1.5-3 cm forward between the first 2 injection sites. The fourth injection site was 1.5 cm posterior to the second injection site.   -Occipitalis muscle injection, 3 sites, bilaterally. The first injection was done one half way between the occipital protuberance and the tip of the mastoid process behind the ear. The second injection site was done lateral and superior to the first, 1 fingerbreadth from the first injection. The third injection site was 1 fingerbreadth superiorly and medially from the first injection site.  -Cervical paraspinal muscle  injection, 2 sites, bilateral knee first injection site was 1 cm from the midline of the cervical spine, 3 cm inferior to the lower border of the occipital protuberance. The second injection site was 1.5 cm superiorly and laterally to the first injection site.  -Trapezius muscle injection was performed at 3 sites, bilaterally. The first injection site was in the upper trapezius muscle halfway between the inflection point of the neck, and the acromion. The second injection site was one half way between the acromion and the first injection site. The third injection was done between the first injection site and the inflection point of the neck.   Will return for repeat injection in 3 months.   155 units of Botox was used, 45u Botox not injected was wasted. The patient tolerated the procedure well, there were no complications of the above procedure.

## 2022-03-01 ENCOUNTER — Other Ambulatory Visit (HOSPITAL_COMMUNITY): Payer: Self-pay

## 2022-03-06 ENCOUNTER — Other Ambulatory Visit (HOSPITAL_COMMUNITY): Payer: Self-pay

## 2022-03-07 ENCOUNTER — Other Ambulatory Visit (HOSPITAL_COMMUNITY): Payer: Self-pay

## 2022-03-10 ENCOUNTER — Other Ambulatory Visit (HOSPITAL_COMMUNITY): Payer: Self-pay

## 2022-03-10 MED ORDER — LEVOTHYROXINE SODIUM 88 MCG PO TABS
88.0000 ug | ORAL_TABLET | Freq: Every day | ORAL | 4 refills | Status: DC
  Filled 2022-03-10 – 2022-04-06 (×2): qty 90, 90d supply, fill #0
  Filled 2022-08-06: qty 90, 90d supply, fill #1
  Filled 2022-10-29: qty 90, 90d supply, fill #2
  Filled 2023-01-24: qty 90, 90d supply, fill #3

## 2022-03-24 ENCOUNTER — Other Ambulatory Visit (HOSPITAL_COMMUNITY): Payer: Self-pay

## 2022-03-27 ENCOUNTER — Other Ambulatory Visit (HOSPITAL_COMMUNITY): Payer: Self-pay

## 2022-04-06 ENCOUNTER — Other Ambulatory Visit (HOSPITAL_COMMUNITY): Payer: Self-pay

## 2022-04-07 ENCOUNTER — Other Ambulatory Visit (HOSPITAL_COMMUNITY): Payer: Self-pay

## 2022-04-24 ENCOUNTER — Other Ambulatory Visit (HOSPITAL_COMMUNITY): Payer: Self-pay

## 2022-04-25 DIAGNOSIS — F411 Generalized anxiety disorder: Secondary | ICD-10-CM | POA: Diagnosis not present

## 2022-04-26 ENCOUNTER — Other Ambulatory Visit (HOSPITAL_COMMUNITY): Payer: Self-pay

## 2022-04-26 ENCOUNTER — Telehealth: Payer: Self-pay | Admitting: Neurology

## 2022-04-26 DIAGNOSIS — J309 Allergic rhinitis, unspecified: Secondary | ICD-10-CM | POA: Diagnosis not present

## 2022-04-26 DIAGNOSIS — F411 Generalized anxiety disorder: Secondary | ICD-10-CM | POA: Diagnosis not present

## 2022-04-26 DIAGNOSIS — E039 Hypothyroidism, unspecified: Secondary | ICD-10-CM | POA: Diagnosis not present

## 2022-04-26 DIAGNOSIS — Z23 Encounter for immunization: Secondary | ICD-10-CM | POA: Diagnosis not present

## 2022-04-26 DIAGNOSIS — E538 Deficiency of other specified B group vitamins: Secondary | ICD-10-CM | POA: Diagnosis not present

## 2022-04-26 DIAGNOSIS — Z0000 Encounter for general adult medical examination without abnormal findings: Secondary | ICD-10-CM | POA: Diagnosis not present

## 2022-04-26 DIAGNOSIS — G43909 Migraine, unspecified, not intractable, without status migrainosus: Secondary | ICD-10-CM | POA: Diagnosis not present

## 2022-04-26 DIAGNOSIS — F900 Attention-deficit hyperactivity disorder, predominantly inattentive type: Secondary | ICD-10-CM | POA: Diagnosis not present

## 2022-04-26 DIAGNOSIS — N938 Other specified abnormal uterine and vaginal bleeding: Secondary | ICD-10-CM | POA: Diagnosis not present

## 2022-04-26 DIAGNOSIS — F3342 Major depressive disorder, recurrent, in full remission: Secondary | ICD-10-CM | POA: Diagnosis not present

## 2022-04-26 DIAGNOSIS — G43711 Chronic migraine without aura, intractable, with status migrainosus: Secondary | ICD-10-CM

## 2022-04-26 MED ORDER — ESCITALOPRAM OXALATE 10 MG PO TABS
10.0000 mg | ORAL_TABLET | Freq: Every day | ORAL | 1 refills | Status: AC
  Filled 2022-04-26: qty 90, 90d supply, fill #0
  Filled 2022-12-11: qty 90, 90d supply, fill #1

## 2022-04-26 MED ORDER — NORETHINDRONE ACET-ETHINYL EST 1-20 MG-MCG PO TABS
1.0000 | ORAL_TABLET | Freq: Every day | ORAL | 4 refills | Status: DC
  Filled 2022-04-26: qty 84, 84d supply, fill #0
  Filled 2022-07-17: qty 84, 84d supply, fill #1
  Filled 2022-10-26: qty 84, 84d supply, fill #2
  Filled 2023-01-24: qty 84, 84d supply, fill #3
  Filled 2023-02-22 – 2023-04-18 (×3): qty 84, 84d supply, fill #4

## 2022-04-26 MED ORDER — LORAZEPAM 0.5 MG PO TABS
0.2500 mg | ORAL_TABLET | Freq: Three times a day (TID) | ORAL | 1 refills | Status: AC | PRN
  Filled 2022-04-26: qty 30, 10d supply, fill #0
  Filled 2022-08-06: qty 30, 10d supply, fill #1

## 2022-04-26 MED ORDER — TRIAMCINOLONE ACETONIDE 55 MCG/ACT NA AERO: INHALATION_SPRAY | NASAL | 3 refills | Status: AC

## 2022-04-26 MED ORDER — BUPROPION HCL ER (XL) 150 MG PO TB24
150.0000 mg | ORAL_TABLET | Freq: Every morning | ORAL | 1 refills | Status: DC
  Filled 2022-04-26 – 2022-07-17 (×2): qty 90, 90d supply, fill #0
  Filled 2022-09-26: qty 90, 90d supply, fill #1

## 2022-04-26 NOTE — Telephone Encounter (Signed)
Sent msg asking pt for updated insurance info for 2024 

## 2022-05-02 NOTE — Telephone Encounter (Signed)
Pt's insurance updated in chart, please start on new auth for botox, thank you!

## 2022-05-05 ENCOUNTER — Other Ambulatory Visit (HOSPITAL_COMMUNITY): Payer: Self-pay

## 2022-05-05 NOTE — Telephone Encounter (Signed)
Pharmacy Patient Advocate Encounter   Received notification from [facility] that prior authorization for Botox 200 Units is required/requested.   PA submitted on 05/05/2022 to (ins) [employer] via [employer] Key [id number] Status is pending

## 2022-05-05 NOTE — Telephone Encounter (Signed)
   Benefit Verification [id number] Submitted!

## 2022-05-08 DIAGNOSIS — F411 Generalized anxiety disorder: Secondary | ICD-10-CM | POA: Diagnosis not present

## 2022-05-09 ENCOUNTER — Encounter: Payer: Self-pay | Admitting: Neurology

## 2022-05-09 ENCOUNTER — Other Ambulatory Visit (HOSPITAL_COMMUNITY): Payer: Self-pay

## 2022-05-09 MED ORDER — UBRELVY 100 MG PO TABS
100.0000 mg | ORAL_TABLET | ORAL | 11 refills | Status: DC | PRN
  Filled 2022-05-09: qty 16, 30d supply, fill #0
  Filled 2022-07-05: qty 16, 30d supply, fill #1
  Filled 2022-07-17 – 2022-08-06 (×2): qty 16, 30d supply, fill #2
  Filled 2022-09-26: qty 16, 30d supply, fill #3
  Filled 2022-10-29: qty 16, 30d supply, fill #4
  Filled 2023-01-24: qty 16, 30d supply, fill #5
  Filled 2023-02-22: qty 16, 30d supply, fill #6
  Filled 2023-04-07: qty 16, 30d supply, fill #7

## 2022-05-10 ENCOUNTER — Other Ambulatory Visit (HOSPITAL_COMMUNITY): Payer: Self-pay

## 2022-05-10 NOTE — Telephone Encounter (Signed)
Checking for an update on auth, pt scheduled for 05/30/22

## 2022-05-10 NOTE — Telephone Encounter (Signed)
I called [employer] to follow up with the PA-they state it is still being processed-I will follow up again in a couple of days.

## 2022-05-12 NOTE — Telephone Encounter (Signed)
Pharmacy benefit- must fill though [facility]

## 2022-05-15 ENCOUNTER — Other Ambulatory Visit: Payer: Self-pay

## 2022-05-15 ENCOUNTER — Other Ambulatory Visit (HOSPITAL_COMMUNITY): Payer: Self-pay

## 2022-05-15 MED ORDER — ONABOTULINUMTOXINA 200 UNITS IJ SOLR
INTRAMUSCULAR | 3 refills | Status: DC
  Filled 2022-05-15: qty 1, 84d supply, fill #0
  Filled 2022-05-15: qty 1, fill #0
  Filled 2022-08-15: qty 1, 84d supply, fill #1
  Filled 2022-10-31: qty 1, 84d supply, fill #2
  Filled 2023-01-24: qty 1, 84d supply, fill #3

## 2022-05-15 NOTE — Addendum Note (Signed)
Addended by: Gildardo Griffes on: 05/15/2022 09:16 AM   Modules accepted: Orders

## 2022-05-15 NOTE — Telephone Encounter (Signed)
Can you please send rx to [facility]?

## 2022-05-15 NOTE — Telephone Encounter (Signed)
Botox 200 unit Rx sent to [facility].

## 2022-05-24 ENCOUNTER — Other Ambulatory Visit (HOSPITAL_COMMUNITY): Payer: Self-pay

## 2022-05-29 ENCOUNTER — Other Ambulatory Visit (HOSPITAL_COMMUNITY): Payer: Self-pay

## 2022-05-30 ENCOUNTER — Ambulatory Visit (INDEPENDENT_AMBULATORY_CARE_PROVIDER_SITE_OTHER): Payer: 59 | Admitting: Neurology

## 2022-05-30 DIAGNOSIS — G43711 Chronic migraine without aura, intractable, with status migrainosus: Secondary | ICD-10-CM | POA: Diagnosis not present

## 2022-05-30 MED ORDER — ONABOTULINUMTOXINA 200 UNITS IJ SOLR
155.0000 [IU] | Freq: Once | INTRAMUSCULAR | Status: AC
  Administered 2022-05-30: 155 [IU] via INTRAMUSCULAR

## 2022-05-30 NOTE — Progress Notes (Signed)
Botox- 200 units x 1 vial Lot: [id number] Expiration: 08/2024 NDC: [id number]  Bacteriostatic 0.9% Sodium Chloride- 4 mL total Lot: [id number] Expiration: 11/25 NDC: [id number]  Dx: [id number] S/P  Witnessed by Claiborne Rigg

## 2022-05-30 NOTE — Progress Notes (Signed)
Consent Form Botulism Toxin Injection For Chronic Migraine 05/30/2022: stable 02/28/2022: doing great, stable 12/06/2021: stable ding great 09/13/2021: stable, doing great 06/13/2021: Stable, still doing great; Patient is doing excellent! She now has4-6 migraine/total headache days a month 11/21/2o22: stable, still doing excellent. >80% improvement reduced to 4 headache/imigraine days a month. Episodic migraines while on botox, gave Qulipta to help further. Has not had ajovy in months and doing phenomenal. . Will continue botox and qulipta. Ubrelvy prn. She does not clench. Botox, ubrelvy,qulipta all approved 02/2021.    Notes: Doesn't like nurtec as much*. Discussed elyxyb and Trudessa and Reyvow as well. Will refill frova and she had a side effect to topiramate. Already tried multiple triptans (sumatriptan, maxalt, almotriptan, eletriptan). Also discussed toradol injections as a possibility.     07/26/2020: Patient is doing excellent! She now has4-6 migraine/total headache days a month, technically episodic migraines, adding on Qulipta for better migraine control in episodic migraine.  Doing excellent, > 80% improvement in migraine frequency. Loves qulipta and Ajovy. Together she is in the best place she has ever been with migraine management.  Meds ordered this encounter  Medications   botulinum toxin Type A (BOTOX) injection 155 Units    Botox- 200 units x 1 vial Lot: [id number] Expiration: 08/2024 NDC: [id number]  Bacteriostatic 0.9% Sodium Chloride- 4 mL total Lot: [id number] Expiration: 11/25 NDC: [id number]  Dx: [id number] S/P  Witnessed by Claiborne Rigg      Reviewed orally with patient, additionally signature is on file:  Botulism toxin has been approved by the Federal drug administration for treatment of chronic migraine. Botulism toxin does not cure chronic migraine and it may not be effective in some patients.  The administration of botulism toxin is accomplished by  injecting a small amount of toxin into the muscles of the neck and head. Dosage must be titrated for each individual. Any benefits resulting from botulism toxin tend to wear off after 3 months with a repeat injection required if benefit is to be maintained. Injections are usually done every 3-4 months with maximum effect peak achieved by about 2 or 3 weeks. Botulism toxin is expensive and you should be sure of what costs you will incur resulting from the injection.  The side effects of botulism toxin use for chronic migraine may include:   -Transient, and usually mild, facial weakness with facial injections  -Transient, and usually mild, head or neck weakness with head/neck injections  -Reduction or loss of forehead facial animation due to forehead muscle weakness  -Eyelid drooping  -Dry eye  -Pain at the site of injection or bruising at the site of injection  -Double vision  -Potential unknown long term risks  Contraindications: You should not have Botox if you are pregnant, nursing, allergic to albumin, have an infection, skin condition, or muscle weakness at the site of the injection, or have myasthenia gravis, Lambert-Eaton syndrome, or ALS.  It is also possible that as with any injection, there may be an allergic reaction or no effect from the medication. Reduced effectiveness after repeated injections is sometimes seen and rarely infection at the injection site may occur. All care will be taken to prevent these side effects. If therapy is given over a long time, atrophy and wasting in the muscle injected may occur. Occasionally the patient's become refractory to treatment because they develop antibodies to the toxin. In this event, therapy needs to be modified.  I have read the above information and consent to the administration of botulism  toxin.    BOTOX PROCEDURE NOTE FOR MIGRAINE HEADACHE    Contraindications and precautions discussed with patient(above). Aseptic procedure was  observed and patient tolerated procedure. Procedure performed by Dr. Georgia Dom  The condition has existed for more than 6 months, and pt does not have a diagnosis of ALS, Myasthenia Gravis or Lambert-Eaton Syndrome.  Risks and benefits of injections discussed and pt agrees to proceed with the procedure.  Written consent obtained  These injections are medically necessary. Pt  receives good benefits from these injections. These injections do not cause sedations or hallucinations which the oral therapies may cause.  Description of procedure:  The patient was placed in a sitting position. The standard protocol was used for Botox as follows, with 5 units of Botox injected at each site:   -Procerus muscle, midline injection  -Corrugator muscle, bilateral injection  -Frontalis muscle, bilateral injection, with 2 sites each side, medial injection was performed in the upper one third of the frontalis muscle, in the region vertical from the medial inferior edge of the superior orbital rim. The lateral injection was again in the upper one third of the forehead vertically above the lateral limbus of the cornea, 1.5 cm lateral to the medial injection site.  -Temporalis muscle injection, 4 sites, bilaterally. The first injection was 3 cm above the tragus of the ear, second injection site was 1.5 cm to 3 cm up from the first injection site in line with the tragus of the ear. The third injection site was 1.5-3 cm forward between the first 2 injection sites. The fourth injection site was 1.5 cm posterior to the second injection site.   -Occipitalis muscle injection, 3 sites, bilaterally. The first injection was done one half way between the occipital protuberance and the tip of the mastoid process behind the ear. The second injection site was done lateral and superior to the first, 1 fingerbreadth from the first injection. The third injection site was 1 fingerbreadth superiorly and medially from the first  injection site.  -Cervical paraspinal muscle injection, 2 sites, bilateral knee first injection site was 1 cm from the midline of the cervical spine, 3 cm inferior to the lower border of the occipital protuberance. The second injection site was 1.5 cm superiorly and laterally to the first injection site.  -Trapezius muscle injection was performed at 3 sites, bilaterally. The first injection site was in the upper trapezius muscle halfway between the inflection point of the neck, and the acromion. The second injection site was one half way between the acromion and the first injection site. The third injection was done between the first injection site and the inflection point of the neck.   Will return for repeat injection in 3 months.   155 units of Botox was used, 45u Botox not injected was wasted. The patient tolerated the procedure well, there were no complications of the above procedure.

## 2022-05-31 ENCOUNTER — Other Ambulatory Visit (HOSPITAL_COMMUNITY): Payer: Self-pay

## 2022-06-01 ENCOUNTER — Other Ambulatory Visit (HOSPITAL_COMMUNITY): Payer: Self-pay

## 2022-06-19 DIAGNOSIS — F411 Generalized anxiety disorder: Secondary | ICD-10-CM | POA: Diagnosis not present

## 2022-07-17 ENCOUNTER — Other Ambulatory Visit: Payer: Self-pay

## 2022-07-18 ENCOUNTER — Other Ambulatory Visit: Payer: Self-pay

## 2022-08-07 ENCOUNTER — Other Ambulatory Visit: Payer: Self-pay

## 2022-08-09 ENCOUNTER — Other Ambulatory Visit (HOSPITAL_COMMUNITY): Payer: Self-pay

## 2022-08-15 ENCOUNTER — Other Ambulatory Visit (HOSPITAL_COMMUNITY): Payer: Self-pay

## 2022-08-17 ENCOUNTER — Other Ambulatory Visit (HOSPITAL_COMMUNITY): Payer: Self-pay

## 2022-08-22 ENCOUNTER — Ambulatory Visit (INDEPENDENT_AMBULATORY_CARE_PROVIDER_SITE_OTHER): Payer: 59 | Admitting: Neurology

## 2022-08-22 DIAGNOSIS — G43711 Chronic migraine without aura, intractable, with status migrainosus: Secondary | ICD-10-CM | POA: Diagnosis not present

## 2022-08-22 MED ORDER — ONABOTULINUMTOXINA 200 UNITS IJ SOLR
155.0000 [IU] | Freq: Once | INTRAMUSCULAR | Status: AC
  Administered 2022-08-22: 155 [IU] via INTRAMUSCULAR

## 2022-08-22 NOTE — Progress Notes (Signed)
Patient signed consent   Botox- 200 units x 1 vial Lot: [id number] Expiration: 08/2024 NDC: 4540-9811-91  Bacteriostatic 0.9% Sodium Chloride- 4 mL  Lot: [id number] Expiration: 06/19/2023 NDC: 6213-0865-78  Dx: G43.711 S/P  Witnessed by S. Young Charity fundraiser

## 2022-08-22 NOTE — Progress Notes (Signed)
Consent Form Botulism Toxin Injection For Chronic Migraine   08/22/2022: stable 05/30/2022: stable 02/28/2022: doing great, stable 12/06/2021: stable ding great 09/13/2021: stable, doing great 06/13/2021: Stable, still doing great; Patient is doing excellent! She now has4-6 migraine/total headache days a month 11/21/2o22: stable, still doing excellent. >80% improvement reduced to 4 headache/imigraine days a month. Episodic migraines while on botox, gave Qulipta to help further. Has not had ajovy in months and doing phenomenal. . Will continue botox and qulipta. Ubrelvy prn. She does not clench. Botox, ubrelvy,qulipta all approved 02/2021.    Notes: Doesn't like nurtec as much*. Discussed elyxyb and Trudessa and Reyvow as well. Will refill frova and she had a side effect to topiramate. Already tried multiple triptans (sumatriptan, maxalt, almotriptan, eletriptan). Also discussed toradol injections as a possibility.     07/26/2020: Patient is doing excellent! She now has4-6 migraine/total headache days a month, technically episodic migraines, adding on Qulipta for better migraine control in episodic migraine.  Doing excellent, > 80% improvement in migraine frequency. Loves qulipta and Ajovy. Together she is in the best place she has ever been with migraine management.  Meds ordered this encounter  Medications   botulinum toxin Type A (BOTOX) injection 155 Units    Botox- 200 units x 1 vial Lot: [id number] Expiration: 08/2024 NDC: 4540-9811-91  Bacteriostatic 0.9% Sodium Chloride- 4 mL  Lot: [id number] Expiration: 06/19/2023 NDC: 6213-0865-78  Dx: I69.629 S/P  Witnessed by S. Young RN      Reviewed orally with patient, additionally signature is on file:  Botulism toxin has been approved by the Federal drug administration for treatment of chronic migraine. Botulism toxin does not cure chronic migraine and it may not be effective in some patients.  The administration of botulism toxin  is accomplished by injecting a small amount of toxin into the muscles of the neck and head. Dosage must be titrated for each individual. Any benefits resulting from botulism toxin tend to wear off after 3 months with a repeat injection required if benefit is to be maintained. Injections are usually done every 3-4 months with maximum effect peak achieved by about 2 or 3 weeks. Botulism toxin is expensive and you should be sure of what costs you will incur resulting from the injection.  The side effects of botulism toxin use for chronic migraine may include:   -Transient, and usually mild, facial weakness with facial injections  -Transient, and usually mild, head or neck weakness with head/neck injections  -Reduction or loss of forehead facial animation due to forehead muscle weakness  -Eyelid drooping  -Dry eye  -Pain at the site of injection or bruising at the site of injection  -Double vision  -Potential unknown long term risks  Contraindications: You should not have Botox if you are pregnant, nursing, allergic to albumin, have an infection, skin condition, or muscle weakness at the site of the injection, or have myasthenia gravis, Lambert-Eaton syndrome, or ALS.  It is also possible that as with any injection, there may be an allergic reaction or no effect from the medication. Reduced effectiveness after repeated injections is sometimes seen and rarely infection at the injection site may occur. All care will be taken to prevent these side effects. If therapy is given over a long time, atrophy and wasting in the muscle injected may occur. Occasionally the patient's become refractory to treatment because they develop antibodies to the toxin. In this event, therapy needs to be modified.  I have read the above information and  consent to the administration of botulism toxin.    BOTOX PROCEDURE NOTE FOR MIGRAINE HEADACHE    Contraindications and precautions discussed with patient(above). Aseptic  procedure was observed and patient tolerated procedure. Procedure performed by Dr. Artemio Aly  The condition has existed for more than 6 months, and pt does not have a diagnosis of ALS, Myasthenia Gravis or Lambert-Eaton Syndrome.  Risks and benefits of injections discussed and pt agrees to proceed with the procedure.  Written consent obtained  These injections are medically necessary. Pt  receives good benefits from these injections. These injections do not cause sedations or hallucinations which the oral therapies may cause.  Description of procedure:  The patient was placed in a sitting position. The standard protocol was used for Botox as follows, with 5 units of Botox injected at each site:   -Procerus muscle, midline injection  -Corrugator muscle, bilateral injection  -Frontalis muscle, bilateral injection, with 2 sites each side, medial injection was performed in the upper one third of the frontalis muscle, in the region vertical from the medial inferior edge of the superior orbital rim. The lateral injection was again in the upper one third of the forehead vertically above the lateral limbus of the cornea, 1.5 cm lateral to the medial injection site.  -Temporalis muscle injection, 4 sites, bilaterally. The first injection was 3 cm above the tragus of the ear, second injection site was 1.5 cm to 3 cm up from the first injection site in line with the tragus of the ear. The third injection site was 1.5-3 cm forward between the first 2 injection sites. The fourth injection site was 1.5 cm posterior to the second injection site.   -Occipitalis muscle injection, 3 sites, bilaterally. The first injection was done one half way between the occipital protuberance and the tip of the mastoid process behind the ear. The second injection site was done lateral and superior to the first, 1 fingerbreadth from the first injection. The third injection site was 1 fingerbreadth superiorly and medially from the  first injection site.  -Cervical paraspinal muscle injection, 2 sites, bilateral knee first injection site was 1 cm from the midline of the cervical spine, 3 cm inferior to the lower border of the occipital protuberance. The second injection site was 1.5 cm superiorly and laterally to the first injection site.  -Trapezius muscle injection was performed at 3 sites, bilaterally. The first injection site was in the upper trapezius muscle halfway between the inflection point of the neck, and the acromion. The second injection site was one half way between the acromion and the first injection site. The third injection was done between the first injection site and the inflection point of the neck.   Will return for repeat injection in 3 months.   155 units of Botox was used, 45u Botox not injected was wasted. The patient tolerated the procedure well, there were no complications of the above procedure.

## 2022-08-31 ENCOUNTER — Other Ambulatory Visit: Payer: Self-pay | Admitting: Family Medicine

## 2022-08-31 DIAGNOSIS — Z1231 Encounter for screening mammogram for malignant neoplasm of breast: Secondary | ICD-10-CM

## 2022-09-26 ENCOUNTER — Other Ambulatory Visit (HOSPITAL_COMMUNITY): Payer: Self-pay

## 2022-09-26 DIAGNOSIS — M722 Plantar fascial fibromatosis: Secondary | ICD-10-CM | POA: Diagnosis not present

## 2022-09-26 MED ORDER — MELOXICAM 15 MG PO TABS
15.0000 mg | ORAL_TABLET | Freq: Every day | ORAL | 0 refills | Status: AC | PRN
  Filled 2022-09-26: qty 30, 30d supply, fill #0

## 2022-09-29 ENCOUNTER — Other Ambulatory Visit (HOSPITAL_COMMUNITY): Payer: Self-pay

## 2022-10-29 ENCOUNTER — Other Ambulatory Visit (HOSPITAL_COMMUNITY): Payer: Self-pay

## 2022-10-30 ENCOUNTER — Other Ambulatory Visit (HOSPITAL_COMMUNITY): Payer: Self-pay

## 2022-10-30 ENCOUNTER — Other Ambulatory Visit: Payer: Self-pay

## 2022-10-30 MED ORDER — LORAZEPAM 0.5 MG PO TABS
0.5000 mg | ORAL_TABLET | Freq: Three times a day (TID) | ORAL | 0 refills | Status: AC
  Filled 2022-10-30: qty 30, 10d supply, fill #0

## 2022-10-30 NOTE — Telephone Encounter (Signed)
Submitted new auth request via cmm, status is pending. Key: [id number]

## 2022-10-31 ENCOUNTER — Other Ambulatory Visit: Payer: Self-pay

## 2022-10-31 ENCOUNTER — Other Ambulatory Visit (HOSPITAL_COMMUNITY): Payer: Self-pay

## 2022-11-02 ENCOUNTER — Other Ambulatory Visit (HOSPITAL_COMMUNITY): Payer: Self-pay

## 2022-11-03 ENCOUNTER — Other Ambulatory Visit (HOSPITAL_COMMUNITY): Payer: Self-pay

## 2022-11-06 ENCOUNTER — Other Ambulatory Visit (HOSPITAL_COMMUNITY): Payer: Self-pay

## 2022-11-07 DIAGNOSIS — E039 Hypothyroidism, unspecified: Secondary | ICD-10-CM | POA: Diagnosis not present

## 2022-11-14 ENCOUNTER — Ambulatory Visit (INDEPENDENT_AMBULATORY_CARE_PROVIDER_SITE_OTHER): Payer: 59 | Admitting: Neurology

## 2022-11-14 DIAGNOSIS — E039 Hypothyroidism, unspecified: Secondary | ICD-10-CM | POA: Diagnosis not present

## 2022-11-14 DIAGNOSIS — G43711 Chronic migraine without aura, intractable, with status migrainosus: Secondary | ICD-10-CM

## 2022-11-14 MED ORDER — ONABOTULINUMTOXINA 200 UNITS IJ SOLR
155.0000 [IU] | Freq: Once | INTRAMUSCULAR | Status: AC
Start: 2022-11-14 — End: 2022-11-14
  Administered 2022-11-14: 155 [IU] via INTRAMUSCULAR

## 2022-11-14 NOTE — Progress Notes (Signed)
Consent Form Botulism Toxin Injection For Chronic Migraine  11/14/2022: doing fantastic, stabvle or improved 08/22/2022: stable 05/30/2022: stable 02/28/2022: doing great, stable 12/06/2021: stable ding great 09/13/2021: stable, doing great 06/13/2021: Stable, still doing great; Patient is doing excellent! She now has4-6 migraine/total headache days a month 11/21/2o22: stable, still doing excellent. >80% improvement reduced to 4 headache/imigraine days a month. Episodic migraines while on botox, gave Qulipta to help further. Has not had ajovy in months and doing phenomenal. . Will continue botox and qulipta. Ubrelvy prn. She does not clench. Botox, ubrelvy,qulipta all approved 02/2021.    Notes: Doesn't like nurtec as much*. Discussed elyxyb and Trudessa and Reyvow as well. Will refill frova and she had a side effect to topiramate. Already tried multiple triptans (sumatriptan, maxalt, almotriptan, eletriptan). Also discussed toradol injections as a possibility.     07/26/2020: Patient is doing excellent! She now has4-6 migraine/total headache days a month, technically episodic migraines, adding on Qulipta for better migraine control in episodic migraine.  Doing excellent, > 80% improvement in migraine frequency. Loves qulipta and Ajovy. Together she is in the best place she has ever been with migraine management.  Meds ordered this encounter  Medications   botulinum toxin Type A (BOTOX) injection 155 Units    Botox 200 units x 1 vial  LOT #: [id number] EXP: 02/2025 NDC: 9562-1308-65  Bacteriostatic 0.9 % Sodium Chloride LOT #: [id number] EXP: 06/19/2023 NDC: 2952-8413-24  Witnessed by Blair Heys      Reviewed orally with patient, additionally signature is on file:  Botulism toxin has been approved by the Federal drug administration for treatment of chronic migraine. Botulism toxin does not cure chronic migraine and it may not be effective in some patients.  The administration of  botulism toxin is accomplished by injecting a small amount of toxin into the muscles of the neck and head. Dosage must be titrated for each individual. Any benefits resulting from botulism toxin tend to wear off after 3 months with a repeat injection required if benefit is to be maintained. Injections are usually done every 3-4 months with maximum effect peak achieved by about 2 or 3 weeks. Botulism toxin is expensive and you should be sure of what costs you will incur resulting from the injection.  The side effects of botulism toxin use for chronic migraine may include:   -Transient, and usually mild, facial weakness with facial injections  -Transient, and usually mild, head or neck weakness with head/neck injections  -Reduction or loss of forehead facial animation due to forehead muscle weakness  -Eyelid drooping  -Dry eye  -Pain at the site of injection or bruising at the site of injection  -Double vision  -Potential unknown long term risks  Contraindications: You should not have Botox if you are pregnant, nursing, allergic to albumin, have an infection, skin condition, or muscle weakness at the site of the injection, or have myasthenia gravis, Lambert-Eaton syndrome, or ALS.  It is also possible that as with any injection, there may be an allergic reaction or no effect from the medication. Reduced effectiveness after repeated injections is sometimes seen and rarely infection at the injection site may occur. All care will be taken to prevent these side effects. If therapy is given over a long time, atrophy and wasting in the muscle injected may occur. Occasionally the patient's become refractory to treatment because they develop antibodies to the toxin. In this event, therapy needs to be modified.  I have read the above  information and consent to the administration of botulism toxin.    BOTOX PROCEDURE NOTE FOR MIGRAINE HEADACHE    Contraindications and precautions discussed with  patient(above). Aseptic procedure was observed and patient tolerated procedure. Procedure performed by Dr. Artemio Aly  The condition has existed for more than 6 months, and pt does not have a diagnosis of ALS, Myasthenia Gravis or Lambert-Eaton Syndrome.  Risks and benefits of injections discussed and pt agrees to proceed with the procedure.  Written consent obtained  These injections are medically necessary. Pt  receives good benefits from these injections. These injections do not cause sedations or hallucinations which the oral therapies may cause.  Description of procedure:  The patient was placed in a sitting position. The standard protocol was used for Botox as follows, with 5 units of Botox injected at each site:   -Procerus muscle, midline injection  -Corrugator muscle, bilateral injection  -Frontalis muscle, bilateral injection, with 2 sites each side, medial injection was performed in the upper one third of the frontalis muscle, in the region vertical from the medial inferior edge of the superior orbital rim. The lateral injection was again in the upper one third of the forehead vertically above the lateral limbus of the cornea, 1.5 cm lateral to the medial injection site.  -Temporalis muscle injection, 4 sites, bilaterally. The first injection was 3 cm above the tragus of the ear, second injection site was 1.5 cm to 3 cm up from the first injection site in line with the tragus of the ear. The third injection site was 1.5-3 cm forward between the first 2 injection sites. The fourth injection site was 1.5 cm posterior to the second injection site.   -Occipitalis muscle injection, 3 sites, bilaterally. The first injection was done one half way between the occipital protuberance and the tip of the mastoid process behind the ear. The second injection site was done lateral and superior to the first, 1 fingerbreadth from the first injection. The third injection site was 1 fingerbreadth  superiorly and medially from the first injection site.  -Cervical paraspinal muscle injection, 2 sites, bilateral knee first injection site was 1 cm from the midline of the cervical spine, 3 cm inferior to the lower border of the occipital protuberance. The second injection site was 1.5 cm superiorly and laterally to the first injection site.  -Trapezius muscle injection was performed at 3 sites, bilaterally. The first injection site was in the upper trapezius muscle halfway between the inflection point of the neck, and the acromion. The second injection site was one half way between the acromion and the first injection site. The third injection was done between the first injection site and the inflection point of the neck.   Will return for repeat injection in 3 months.   155 units of Botox was used, 45u Botox not injected was wasted. The patient tolerated the procedure well, there were no complications of the above procedure.

## 2022-11-14 NOTE — Progress Notes (Signed)
Botox 200 units x 1 vial Used 100 units from lot. There was a spill of the other 55 units of botox during administration.  LOT #: [id number] EXP: 02/2025 NDC: 6384-5364-68  Bacteriostatic 0.9 % Sodium Chloride LOT #: [id number] EXP: 06/19/2023 NDC: 4825-0037-04  Witnessed by Toma Copier  The other 55 units of Botox came from the following vial: Botox- 200 units x 1 vial Lot: [id number] Expiration: 02/2025 NDC: 8889-1694-50   Bacteriostatic 0.9% Sodium Chloride- 4 mL  Lot: [id number] Expiration: 06/19/2023 NDC: 0034-9179-15   Dx: A56.979    S/P

## 2022-11-22 ENCOUNTER — Other Ambulatory Visit (HOSPITAL_COMMUNITY): Payer: Self-pay

## 2022-11-22 ENCOUNTER — Other Ambulatory Visit (HOSPITAL_BASED_OUTPATIENT_CLINIC_OR_DEPARTMENT_OTHER): Payer: Self-pay

## 2022-11-22 DIAGNOSIS — F411 Generalized anxiety disorder: Secondary | ICD-10-CM | POA: Diagnosis not present

## 2022-11-22 DIAGNOSIS — F3342 Major depressive disorder, recurrent, in full remission: Secondary | ICD-10-CM | POA: Diagnosis not present

## 2022-11-22 MED ORDER — BUPROPION HCL ER (XL) 150 MG PO TB24
150.0000 mg | ORAL_TABLET | Freq: Every morning | ORAL | 1 refills | Status: DC
  Filled 2022-11-22 – 2022-12-28 (×2): qty 90, 90d supply, fill #0
  Filled 2023-04-07 – 2023-04-18 (×2): qty 90, 90d supply, fill #1

## 2022-11-22 MED ORDER — LORAZEPAM 0.5 MG PO TABS
0.2500 mg | ORAL_TABLET | Freq: Three times a day (TID) | ORAL | 2 refills | Status: AC | PRN
  Filled 2022-11-22 – 2023-01-24 (×2): qty 30, 10d supply, fill #0
  Filled 2023-02-22: qty 30, 10d supply, fill #1

## 2022-11-22 MED ORDER — ESCITALOPRAM OXALATE 20 MG PO TABS
20.0000 mg | ORAL_TABLET | Freq: Every day | ORAL | 1 refills | Status: DC
  Filled 2022-11-27: qty 90, 90d supply, fill #0
  Filled 2023-02-22: qty 90, 90d supply, fill #1

## 2022-11-27 ENCOUNTER — Other Ambulatory Visit (HOSPITAL_COMMUNITY): Payer: Self-pay

## 2022-11-27 ENCOUNTER — Other Ambulatory Visit: Payer: Self-pay

## 2022-12-13 ENCOUNTER — Telehealth: Payer: Self-pay

## 2022-12-13 ENCOUNTER — Other Ambulatory Visit (HOSPITAL_COMMUNITY): Payer: Self-pay

## 2022-12-13 NOTE — Telephone Encounter (Signed)
Pharmacy Patient Advocate Encounter   Received notification from CoverMyMeds that prior authorization for Qulipta 60MG  tablets is required/requested.   Insurance verification completed.   The patient is insured through [facility] .   Per test claim: PA required; PA submitted to [employer] via CoverMyMeds Key/confirmation #/EOC [id number] Status is pending

## 2022-12-13 NOTE — Telephone Encounter (Signed)
Pharmacy Patient Advocate Encounter  Received notification from [facility] that Prior Authorization for Qulipta 60MG  tablets has been APPROVED from 12/13/2022 to 12/13/2023   PA #/Case ID/Reference #: PA Case ID #: [id number]

## 2022-12-15 ENCOUNTER — Other Ambulatory Visit (HOSPITAL_COMMUNITY): Payer: Self-pay

## 2022-12-27 ENCOUNTER — Ambulatory Visit
Admission: RE | Admit: 2022-12-27 | Discharge: 2022-12-27 | Disposition: A | Discharge status: Home / Self Care | Payer: 59 | Source: Ambulatory Visit | Attending: Family Medicine | Admitting: Family Medicine

## 2022-12-27 DIAGNOSIS — Z1231 Encounter for screening mammogram for malignant neoplasm of breast: Secondary | ICD-10-CM

## 2022-12-28 ENCOUNTER — Other Ambulatory Visit: Payer: Self-pay | Admitting: Neurology

## 2022-12-28 ENCOUNTER — Other Ambulatory Visit: Payer: Self-pay

## 2022-12-28 ENCOUNTER — Other Ambulatory Visit (HOSPITAL_COMMUNITY): Payer: Self-pay

## 2022-12-28 MED ORDER — QULIPTA 60 MG PO TABS
60.0000 mg | ORAL_TABLET | Freq: Every day | ORAL | 2 refills | Status: DC
  Filled 2022-12-28: qty 30, 30d supply, fill #0
  Filled 2023-01-24: qty 30, 30d supply, fill #1
  Filled 2023-02-22: qty 30, 30d supply, fill #2

## 2022-12-29 ENCOUNTER — Other Ambulatory Visit (HOSPITAL_COMMUNITY): Payer: Self-pay

## 2023-01-01 ENCOUNTER — Other Ambulatory Visit: Payer: Self-pay | Admitting: Family Medicine

## 2023-01-01 DIAGNOSIS — R928 Other abnormal and inconclusive findings on diagnostic imaging of breast: Secondary | ICD-10-CM

## 2023-01-15 ENCOUNTER — Other Ambulatory Visit: Payer: Self-pay

## 2023-01-19 ENCOUNTER — Ambulatory Visit
Admission: RE | Admit: 2023-01-19 | Discharge: 2023-01-19 | Disposition: A | Discharge status: Home / Self Care | Payer: Self-pay | Source: Ambulatory Visit | Attending: Family Medicine | Admitting: Family Medicine

## 2023-01-19 DIAGNOSIS — N6315 Unspecified lump in the right breast, overlapping quadrants: Secondary | ICD-10-CM | POA: Diagnosis not present

## 2023-01-19 DIAGNOSIS — R928 Other abnormal and inconclusive findings on diagnostic imaging of breast: Secondary | ICD-10-CM

## 2023-01-24 ENCOUNTER — Other Ambulatory Visit (HOSPITAL_COMMUNITY): Payer: Self-pay | Admitting: Pharmacy Technician

## 2023-01-24 ENCOUNTER — Other Ambulatory Visit (HOSPITAL_COMMUNITY): Payer: Self-pay

## 2023-01-24 NOTE — Progress Notes (Signed)
Specialty Pharmacy Refill Coordination Note  Heidi Walker is a 42 y.o. female contacted today regarding refills of specialty medication(s) Onabotulinumtoxina   Patient requested Courier to Provider Office   Delivery date: 02/01/23   Verified address: [facility] [address]   Medication will be filled on 01/31/23.

## 2023-01-31 ENCOUNTER — Other Ambulatory Visit: Payer: Self-pay

## 2023-02-07 ENCOUNTER — Ambulatory Visit: Payer: 59 | Admitting: Neurology

## 2023-02-07 DIAGNOSIS — G43711 Chronic migraine without aura, intractable, with status migrainosus: Secondary | ICD-10-CM

## 2023-02-07 DIAGNOSIS — G43831 Menstrual migraine, intractable, with status migrainosus: Secondary | ICD-10-CM

## 2023-02-07 MED ORDER — ONABOTULINUMTOXINA 200 UNITS IJ SOLR
155.0000 [IU] | Freq: Once | INTRAMUSCULAR | Status: AC
Start: 2023-02-07 — End: 2023-02-07
  Administered 2023-02-07: 155 [IU] via INTRAMUSCULAR

## 2023-02-07 NOTE — Progress Notes (Signed)
Botox- 200 units x 1 vial Lot: [id number] Expiration: 05/2025 NDC: 4540-9811-91  Bacteriostatic 0.9% Sodium Chloride- 4  mL  Lot: [id number] Expiration: 06/2023 NDC: 4782-9562-13  Dx: Y86.578  B/B Witnessed by Delmer Islam

## 2023-02-07 NOTE — Progress Notes (Signed)
Consent Form Botulism Toxin Injection For Chronic Migraine  02/07/2023:  Try to get her frovatriptan for her menstrual migraines. She has a migraine every day during her cycle.  Frovatriptan is use doing menses for menstrual migraines because it is a 26-hour half-life and 1 dose in the morning for every day of her menses can increase the quality of her life tremendously because we can avoid daily severe migraines.  She has also tried every other migraine > 3 months for this including: Rizatriptan 10mg , sumatriptan 100mg  (injection and oral and nasal), zolmitriptan 5mg , Eletriptan 40mg , naratriptan 2.5, almotriptan 12.5, frovatriptan 2.5 is the only one that has worked and significantly improves her quality of life for her menstrual migraines  Patient feels that her migraines cause her jaw to ache in her jaw aching also can cause her migraines to worsen it is a trigger for migraines included 10 units in each masseter to see if that helps with migraine severity.   11/14/2022: doing fantastic, stabvle or improved 08/22/2022: stable 05/30/2022: stable 02/28/2022: doing great, stable 12/06/2021: stable ding great 09/13/2021: stable, doing great 06/13/2021: Stable, still doing great; Patient is doing excellent! She now has4-6 migraine/total headache days a month 11/21/2o22: stable, still doing excellent. >80% improvement reduced to 4 headache/imigraine days a month. Episodic migraines while on botox, gave Qulipta to help further. Has not had ajovy in months and doing phenomenal. . Will continue botox and qulipta. Ubrelvy prn. She does not clench. Botox, ubrelvy,qulipta all approved 02/2021.    Notes: Doesn't like nurtec as much*. Discussed elyxyb and Trudessa and Reyvow as well. Will refill frova and she had a side effect to topiramate. Already tried multiple triptans (sumatriptan, maxalt, almotriptan, eletriptan). Also discussed toradol injections as a possibility.     07/26/2020: Patient is doing  excellent! She now has4-6 migraine/total headache days a month, technically episodic migraines, adding on Qulipta for better migraine control in episodic migraine.  Doing excellent, > 80% improvement in migraine frequency. Loves qulipta and Ajovy. Together she is in the best place she has ever been with migraine management.  Meds ordered this encounter  Medications   botulinum toxin Type A (BOTOX) injection 155 Units    Botox- 200 units x 1 vial Lot: [id number] Expiration: 05/2025 NDC: 8469-6295-28  Bacteriostatic 0.9% Sodium Chloride- 4  mL  Lot: [id number] Expiration: 06/2023 NDC: 4132-4401-02  Dx: V25.366  B/B Witnessed by Delmer Islam      Reviewed orally with patient, additionally signature is on file:  Botulism toxin has been approved by the Federal drug administration for treatment of chronic migraine. Botulism toxin does not cure chronic migraine and it may not be effective in some patients.  The administration of botulism toxin is accomplished by injecting a small amount of toxin into the muscles of the neck and head. Dosage must be titrated for each individual. Any benefits resulting from botulism toxin tend to wear off after 3 months with a repeat injection required if benefit is to be maintained. Injections are usually done every 3-4 months with maximum effect peak achieved by about 2 or 3 weeks. Botulism toxin is expensive and you should be sure of what costs you will incur resulting from the injection.  The side effects of botulism toxin use for chronic migraine may include:   -Transient, and usually mild, facial weakness with facial injections  -Transient, and usually mild, head or neck weakness with head/neck injections  -Reduction or loss of forehead facial animation due to forehead muscle weakness  -  Eyelid drooping  -Dry eye  -Pain at the site of injection or bruising at the site of injection  -Double vision  -Potential unknown long term risks  Contraindications: You  should not have Botox if you are pregnant, nursing, allergic to albumin, have an infection, skin condition, or muscle weakness at the site of the injection, or have myasthenia gravis, Lambert-Eaton syndrome, or ALS.  It is also possible that as with any injection, there may be an allergic reaction or no effect from the medication. Reduced effectiveness after repeated injections is sometimes seen and rarely infection at the injection site may occur. All care will be taken to prevent these side effects. If therapy is given over a long time, atrophy and wasting in the muscle injected may occur. Occasionally the patient's become refractory to treatment because they develop antibodies to the toxin. In this event, therapy needs to be modified.  I have read the above information and consent to the administration of botulism toxin.    BOTOX PROCEDURE NOTE FOR MIGRAINE HEADACHE    Contraindications and precautions discussed with patient(above). Aseptic procedure was observed and patient tolerated procedure. Procedure performed by Dr. Artemio Aly  The condition has existed for more than 6 months, and pt does not have a diagnosis of ALS, Myasthenia Gravis or Lambert-Eaton Syndrome.  Risks and benefits of injections discussed and pt agrees to proceed with the procedure.  Written consent obtained  These injections are medically necessary. Pt  receives good benefits from these injections. These injections do not cause sedations or hallucinations which the oral therapies may cause.  Description of procedure:  The patient was placed in a sitting position. The standard protocol was used for Botox as follows, with 5 units of Botox injected at each site:   -Procerus muscle, midline injection  -Corrugator muscle, bilateral injection  -Frontalis muscle, bilateral injection, with 2 sites each side, medial injection was performed in the upper one third of the frontalis muscle, in the region vertical from the  medial inferior edge of the superior orbital rim. The lateral injection was again in the upper one third of the forehead vertically above the lateral limbus of the cornea, 1.5 cm lateral to the medial injection site.  -Temporalis muscle injection, 4 sites, bilaterally. The first injection was 3 cm above the tragus of the ear, second injection site was 1.5 cm to 3 cm up from the first injection site in line with the tragus of the ear. The third injection site was 1.5-3 cm forward between the first 2 injection sites. The fourth injection site was 1.5 cm posterior to the second injection site.   -Occipitalis muscle injection, 3 sites, bilaterally. The first injection was done one half way between the occipital protuberance and the tip of the mastoid process behind the ear. The second injection site was done lateral and superior to the first, 1 fingerbreadth from the first injection. The third injection site was 1 fingerbreadth superiorly and medially from the first injection site.  -Cervical paraspinal muscle injection, 2 sites, bilateral knee first injection site was 1 cm from the midline of the cervical spine, 3 cm inferior to the lower border of the occipital protuberance. The second injection site was 1.5 cm superiorly and laterally to the first injection site.  -Trapezius muscle injection was performed at 3 sites, bilaterally. The first injection site was in the upper trapezius muscle halfway between the inflection point of the neck, and the acromion. The second injection site was one half way  between the acromion and the first injection site. The third injection was done between the first injection site and the inflection point of the neck.   Will return for repeat injection in 3 months.   155 units of Botox was used, 45u Botox not injected was wasted. The patient tolerated the procedure well, there were no complications of the above procedure.

## 2023-02-11 ENCOUNTER — Other Ambulatory Visit (HOSPITAL_COMMUNITY): Payer: Self-pay

## 2023-02-11 MED ORDER — FROVATRIPTAN SUCCINATE 2.5 MG PO TABS
2.5000 mg | ORAL_TABLET | ORAL | 0 refills | Status: DC | PRN
  Filled 2023-02-11: qty 9, 15d supply, fill #0
  Filled 2023-02-28: qty 9, 30d supply, fill #0

## 2023-02-12 ENCOUNTER — Telehealth: Payer: Self-pay | Admitting: *Deleted

## 2023-02-12 ENCOUNTER — Telehealth: Payer: Self-pay

## 2023-02-12 ENCOUNTER — Other Ambulatory Visit (HOSPITAL_COMMUNITY): Payer: Self-pay

## 2023-02-12 ENCOUNTER — Other Ambulatory Visit: Payer: Self-pay

## 2023-02-12 MED ORDER — ONABOTULINUMTOXINA 200 UNITS IJ SOLR
155.0000 [IU] | Freq: Once | INTRAMUSCULAR | Status: AC
Start: 2023-02-12 — End: ?

## 2023-02-12 NOTE — Addendum Note (Signed)
Addended by: Raynald Kemp A on: 02/12/2023 08:57 AM   Modules accepted: Orders

## 2023-02-12 NOTE — Telephone Encounter (Signed)
Noted  

## 2023-02-12 NOTE — Telephone Encounter (Signed)
Pharmacy Patient Advocate Encounter   Received notification from Physician's Office that prior authorization for Frovatriptan Succinate 2.5MG  tablets is required/requested.   Insurance verification completed.   The patient is insured through [facility] .   Per test claim: PA required; PA submitted to above mentioned insurance via CoverMyMeds Key/confirmation #/EOC [id number] Status is pending

## 2023-02-13 ENCOUNTER — Other Ambulatory Visit (HOSPITAL_COMMUNITY): Payer: Self-pay

## 2023-02-13 NOTE — Telephone Encounter (Signed)
Pharmacy Patient Advocate Encounter  Received notification from [facility] that Prior Authorization for Frovatriptan Succinate 2.5MG  tablets has been APPROVED from 02/12/2023 to 02/11/2024. Ran test claim, Copay is $0. This test claim was processed through [facility]- copay amounts may vary at other pharmacies due to pharmacy/plan contracts, or as the patient moves through the different stages of their insurance plan.   PA #/Case ID/Reference #: PA Case ID #: [id number]

## 2023-02-16 ENCOUNTER — Other Ambulatory Visit (HOSPITAL_COMMUNITY): Payer: Self-pay

## 2023-02-23 ENCOUNTER — Other Ambulatory Visit: Payer: Self-pay

## 2023-02-26 ENCOUNTER — Other Ambulatory Visit (HOSPITAL_COMMUNITY): Payer: Self-pay

## 2023-02-28 ENCOUNTER — Other Ambulatory Visit: Payer: Self-pay

## 2023-03-05 ENCOUNTER — Other Ambulatory Visit (HOSPITAL_COMMUNITY): Payer: Self-pay

## 2023-03-06 ENCOUNTER — Other Ambulatory Visit: Payer: Self-pay

## 2023-04-07 ENCOUNTER — Other Ambulatory Visit: Payer: Self-pay | Admitting: Neurology

## 2023-04-07 DIAGNOSIS — R1112 Projectile vomiting: Secondary | ICD-10-CM

## 2023-04-07 DIAGNOSIS — G43711 Chronic migraine without aura, intractable, with status migrainosus: Secondary | ICD-10-CM

## 2023-04-07 DIAGNOSIS — G43831 Menstrual migraine, intractable, with status migrainosus: Secondary | ICD-10-CM

## 2023-04-09 ENCOUNTER — Other Ambulatory Visit: Payer: Self-pay

## 2023-04-10 ENCOUNTER — Other Ambulatory Visit: Payer: Self-pay

## 2023-04-11 MED ORDER — PROCHLORPERAZINE MALEATE 10 MG PO TABS
10.0000 mg | ORAL_TABLET | Freq: Four times a day (QID) | ORAL | 5 refills | Status: DC | PRN
  Filled 2023-04-11: qty 30, 8d supply, fill #0

## 2023-04-11 MED ORDER — FROVATRIPTAN SUCCINATE 2.5 MG PO TABS
2.5000 mg | ORAL_TABLET | ORAL | 3 refills | Status: DC | PRN
  Filled 2023-04-11: qty 9, 30d supply, fill #0
  Filled 2023-04-27 – 2023-09-17 (×3): qty 9, 30d supply, fill #1

## 2023-04-11 MED ORDER — QULIPTA 60 MG PO TABS
60.0000 mg | ORAL_TABLET | Freq: Every day | ORAL | 2 refills | Status: DC
  Filled 2023-04-11 – 2023-04-16 (×2): qty 30, 30d supply, fill #0

## 2023-04-11 MED ORDER — ONDANSETRON HCL 4 MG PO TABS
4.0000 mg | ORAL_TABLET | Freq: Three times a day (TID) | ORAL | 5 refills | Status: DC | PRN
  Filled 2023-04-11: qty 30, 5d supply, fill #0
  Filled 2023-05-30: qty 30, 5d supply, fill #1

## 2023-04-12 ENCOUNTER — Other Ambulatory Visit (HOSPITAL_COMMUNITY): Payer: Self-pay

## 2023-04-12 ENCOUNTER — Other Ambulatory Visit: Payer: Self-pay

## 2023-04-13 ENCOUNTER — Other Ambulatory Visit: Payer: Self-pay

## 2023-04-13 ENCOUNTER — Other Ambulatory Visit (HOSPITAL_COMMUNITY): Payer: Self-pay

## 2023-04-16 ENCOUNTER — Other Ambulatory Visit: Payer: Self-pay

## 2023-04-16 ENCOUNTER — Other Ambulatory Visit (HOSPITAL_COMMUNITY): Payer: Self-pay

## 2023-04-16 ENCOUNTER — Encounter: Payer: Self-pay | Admitting: Neurology

## 2023-04-16 MED ORDER — QULIPTA 60 MG PO TABS: 60.0000 mg | ORAL_TABLET | Freq: Every day | ORAL | Status: DC

## 2023-04-16 NOTE — Telephone Encounter (Signed)
1 box of Qulipta 60 mg (4 tablets) placed at front desk to bridge pt's refill. Med list included with medication.

## 2023-04-17 ENCOUNTER — Other Ambulatory Visit: Payer: Self-pay

## 2023-04-18 ENCOUNTER — Other Ambulatory Visit (HOSPITAL_COMMUNITY): Payer: Self-pay

## 2023-04-18 MED ORDER — LEVOTHYROXINE SODIUM 88 MCG PO TABS
ORAL_TABLET | ORAL | 4 refills | Status: DC
  Filled 2023-04-18: qty 90, 90d supply, fill #0
  Filled 2023-07-16: qty 30, 30d supply, fill #1
  Filled 2023-08-14: qty 30, 30d supply, fill #2
  Filled 2023-09-17: qty 30, 30d supply, fill #3
  Filled 2023-10-15: qty 30, 30d supply, fill #4
  Filled 2023-11-21: qty 30, 30d supply, fill #5
  Filled 2023-12-09: qty 90, 90d supply, fill #6
  Filled 2023-12-15: qty 30, 30d supply, fill #6
  Filled 2024-01-16: qty 30, 30d supply, fill #7
  Filled 2024-02-19: qty 30, 30d supply, fill #8
  Filled 2024-03-18: qty 30, 30d supply, fill #9
  Filled 2024-04-05 – 2024-04-11 (×2): qty 30, 30d supply, fill #10

## 2023-04-21 ENCOUNTER — Other Ambulatory Visit (HOSPITAL_COMMUNITY): Payer: Self-pay

## 2023-04-21 ENCOUNTER — Other Ambulatory Visit: Payer: Self-pay

## 2023-04-24 ENCOUNTER — Other Ambulatory Visit: Payer: Self-pay | Admitting: Neurology

## 2023-04-24 ENCOUNTER — Other Ambulatory Visit (HOSPITAL_COMMUNITY): Payer: Self-pay

## 2023-04-24 ENCOUNTER — Other Ambulatory Visit (HOSPITAL_COMMUNITY): Payer: Self-pay | Admitting: Pharmacy Technician

## 2023-04-24 ENCOUNTER — Other Ambulatory Visit: Payer: Self-pay

## 2023-04-24 DIAGNOSIS — G43711 Chronic migraine without aura, intractable, with status migrainosus: Secondary | ICD-10-CM

## 2023-04-24 MED ORDER — BOTOX 200 UNITS IJ SOLR
INTRAMUSCULAR | 3 refills | Status: AC
  Filled 2023-04-24: qty 1, 84d supply, fill #0
  Filled 2023-07-30 (×3): qty 1, 84d supply, fill #1
  Filled 2023-10-23: qty 1, 84d supply, fill #2
  Filled 2024-02-04: qty 1, 84d supply, fill #3

## 2023-04-24 NOTE — Progress Notes (Signed)
 Specialty Pharmacy Refill Coordination Note  Heidi Walker is a 43 y.o. female contacted today regarding refills of specialty medication(s) OnabotulinumtoxinA  (BOTOX )   Patient requested Courier to Provider Office   Delivery date: 05/03/23   Verified address: [facility] [address]   Medication will be filled on 0212/25.  RR sent to MD

## 2023-04-24 NOTE — Telephone Encounter (Signed)
 Rx refilled.

## 2023-04-27 ENCOUNTER — Other Ambulatory Visit: Payer: Self-pay

## 2023-04-27 ENCOUNTER — Other Ambulatory Visit (HOSPITAL_COMMUNITY): Payer: Self-pay

## 2023-05-01 ENCOUNTER — Telehealth: Payer: Self-pay | Admitting: Pharmacist

## 2023-05-01 ENCOUNTER — Other Ambulatory Visit (HOSPITAL_COMMUNITY): Payer: Self-pay

## 2023-05-01 NOTE — Telephone Encounter (Signed)
Pharmacy Patient Advocate Encounter   Received notification from Patient Pharmacy that prior authorization for Ubrelvy 100MG  tablets is required/requested.   Insurance verification completed.   The patient is insured through [facility] .   Per test claim: PA required; PA submitted to above mentioned insurance via CoverMyMeds Key/confirmation #/EOC [id number] Status is pending

## 2023-05-02 ENCOUNTER — Other Ambulatory Visit: Payer: Self-pay

## 2023-05-02 ENCOUNTER — Other Ambulatory Visit (HOSPITAL_COMMUNITY): Payer: Self-pay

## 2023-05-02 DIAGNOSIS — Z124 Encounter for screening for malignant neoplasm of cervix: Secondary | ICD-10-CM | POA: Diagnosis not present

## 2023-05-02 DIAGNOSIS — E039 Hypothyroidism, unspecified: Secondary | ICD-10-CM | POA: Diagnosis not present

## 2023-05-02 DIAGNOSIS — F3342 Major depressive disorder, recurrent, in full remission: Secondary | ICD-10-CM | POA: Diagnosis not present

## 2023-05-02 DIAGNOSIS — J309 Allergic rhinitis, unspecified: Secondary | ICD-10-CM | POA: Diagnosis not present

## 2023-05-02 DIAGNOSIS — E538 Deficiency of other specified B group vitamins: Secondary | ICD-10-CM | POA: Diagnosis not present

## 2023-05-02 DIAGNOSIS — Z13220 Encounter for screening for lipoid disorders: Secondary | ICD-10-CM | POA: Diagnosis not present

## 2023-05-02 DIAGNOSIS — Z0000 Encounter for general adult medical examination without abnormal findings: Secondary | ICD-10-CM | POA: Diagnosis not present

## 2023-05-02 DIAGNOSIS — G43909 Migraine, unspecified, not intractable, without status migrainosus: Secondary | ICD-10-CM | POA: Diagnosis not present

## 2023-05-02 DIAGNOSIS — N938 Other specified abnormal uterine and vaginal bleeding: Secondary | ICD-10-CM | POA: Diagnosis not present

## 2023-05-02 DIAGNOSIS — F411 Generalized anxiety disorder: Secondary | ICD-10-CM | POA: Diagnosis not present

## 2023-05-02 DIAGNOSIS — F900 Attention-deficit hyperactivity disorder, predominantly inattentive type: Secondary | ICD-10-CM | POA: Diagnosis not present

## 2023-05-02 MED ORDER — METHOCARBAMOL 500 MG PO TABS
500.0000 mg | ORAL_TABLET | Freq: Three times a day (TID) | ORAL | 0 refills | Status: AC | PRN
  Filled 2023-05-02: qty 20, 7d supply, fill #0

## 2023-05-02 MED ORDER — BUPROPION HCL ER (XL) 150 MG PO TB24
150.0000 mg | ORAL_TABLET | Freq: Every morning | ORAL | 1 refills | Status: DC
  Filled 2023-05-02 – 2023-07-02 (×2): qty 90, 90d supply, fill #0
  Filled 2023-09-17: qty 90, 90d supply, fill #1

## 2023-05-02 MED ORDER — ESCITALOPRAM OXALATE 20 MG PO TABS
20.0000 mg | ORAL_TABLET | Freq: Every day | ORAL | 1 refills | Status: DC
  Filled 2023-05-02 – 2023-05-30 (×2): qty 90, 90d supply, fill #0
  Filled 2023-08-30: qty 90, 90d supply, fill #1

## 2023-05-02 MED ORDER — NORETHINDRONE ACET-ETHINYL EST 1-20 MG-MCG PO TABS
1.0000 | ORAL_TABLET | Freq: Every day | ORAL | 4 refills | Status: AC
  Filled 2023-05-02 – 2023-07-02 (×2): qty 84, 84d supply, fill #0
  Filled 2023-09-17: qty 84, 84d supply, fill #1
  Filled 2023-12-09: qty 84, 84d supply, fill #2
  Filled 2024-03-02: qty 84, 84d supply, fill #3

## 2023-05-02 MED ORDER — LORAZEPAM 0.5 MG PO TABS
0.2500 mg | ORAL_TABLET | Freq: Three times a day (TID) | ORAL | 2 refills | Status: AC | PRN
  Filled 2023-05-02: qty 30, 10d supply, fill #0
  Filled 2023-05-30: qty 30, 10d supply, fill #1

## 2023-05-03 ENCOUNTER — Other Ambulatory Visit (HOSPITAL_COMMUNITY): Payer: Self-pay

## 2023-05-04 NOTE — Telephone Encounter (Signed)
Pharmacy Patient Advocate Encounter  Received notification from [facility] that Prior Authorization for Ubrelvy 100MG  tablets has been APPROVED from 05/03/2023 to 05/01/2024   PA #/Case ID/Reference #: [id number]  Pharmacy notified

## 2023-05-07 ENCOUNTER — Other Ambulatory Visit (HOSPITAL_COMMUNITY): Payer: Self-pay

## 2023-05-07 ENCOUNTER — Ambulatory Visit (INDEPENDENT_AMBULATORY_CARE_PROVIDER_SITE_OTHER): Payer: 59 | Admitting: Neurology

## 2023-05-07 ENCOUNTER — Encounter: Payer: Self-pay | Admitting: Neurology

## 2023-05-07 DIAGNOSIS — G43711 Chronic migraine without aura, intractable, with status migrainosus: Secondary | ICD-10-CM

## 2023-05-07 DIAGNOSIS — G43009 Migraine without aura, not intractable, without status migrainosus: Secondary | ICD-10-CM

## 2023-05-07 MED ORDER — QULIPTA 60 MG PO TABS
60.0000 mg | ORAL_TABLET | Freq: Every day | ORAL | 11 refills | Status: DC
  Filled 2023-05-07 – 2023-05-10 (×2): qty 30, 30d supply, fill #0
  Filled 2023-06-18: qty 30, 30d supply, fill #1
  Filled 2023-07-02 – 2023-07-16 (×2): qty 30, 30d supply, fill #2
  Filled 2023-08-14: qty 30, 30d supply, fill #3
  Filled 2023-09-17: qty 30, 30d supply, fill #4
  Filled 2023-10-15: qty 30, 30d supply, fill #5

## 2023-05-07 MED ORDER — ONABOTULINUMTOXINA 200 UNITS IJ SOLR
155.0000 [IU] | Freq: Once | INTRAMUSCULAR | Status: AC
  Administered 2023-05-07: 155 [IU] via INTRAMUSCULAR

## 2023-05-07 NOTE — Progress Notes (Signed)
Consent Form Botulism Toxin Injection For Chronic Migraine  05/07/2023: Patient does excellent with botox >50% improvement with botox. She has 6 remaining migraines and 10 remaining total headache days a month which are relieved by qulipta. Being on both has been excellent.   02/07/2023:  Try to get her frovatriptan for her menstrual migraines. She has a migraine every day during her cycle.  Frovatriptan is use doing menses for menstrual migraines because it is a 26-hour half-life and 1 dose in the morning for every day of her menses can increase the quality of her life tremendously because we can avoid daily severe migraines.  She has also tried every other migraine > 3 months for this including: Rizatriptan 10mg , sumatriptan 100mg  (injection and oral and nasal), zolmitriptan 5mg , Eletriptan 40mg , naratriptan 2.5, almotriptan 12.5, frovatriptan 2.5 is the only one that has worked and significantly improves her quality of life for her menstrual migraines  Patient feels that her migraines cause her jaw to ache in her jaw aching also can cause her migraines to worsen it is a trigger for migraines included 10 units in each masseter to see if that helps with migraine severity.   11/14/2022: doing fantastic, stabvle or improved 08/22/2022: stable 05/30/2022: stable 02/28/2022: doing great, stable 12/06/2021: stable ding great 09/13/2021: stable, doing great 06/13/2021: Stable, still doing great; Patient is doing excellent! She now has4-6 migraine/total headache days a month 11/21/2o22: stable, still doing excellent. >80% improvement reduced to 4 headache/imigraine days a month. Episodic migraines while on botox, gave Qulipta to help further. Has not had ajovy in months and doing phenomenal. . Will continue botox and qulipta. Ubrelvy prn. She does not clench. Botox, ubrelvy,qulipta all approved 02/2021.    Notes: Doesn't like nurtec as much*. Discussed elyxyb and Trudessa and Reyvow as well. Will refill  frova and she had a side effect to topiramate. Already tried multiple triptans (sumatriptan, maxalt, almotriptan, eletriptan). Also discussed toradol injections as a possibility.     07/26/2020: Patient is doing excellent! She now has4-6 migraine/total headache days a month, technically episodic migraines, adding on Qulipta for better migraine control in episodic migraine.  Doing excellent, > 80% improvement in migraine frequency. Loves qulipta and Ajovy. Together she is in the best place she has ever been with migraine management.  No orders of the defined types were placed in this encounter.     Reviewed orally with patient, additionally signature is on file:  Botulism toxin has been approved by the Federal drug administration for treatment of chronic migraine. Botulism toxin does not cure chronic migraine and it may not be effective in some patients.  The administration of botulism toxin is accomplished by injecting a small amount of toxin into the muscles of the neck and head. Dosage must be titrated for each individual. Any benefits resulting from botulism toxin tend to wear off after 3 months with a repeat injection required if benefit is to be maintained. Injections are usually done every 3-4 months with maximum effect peak achieved by about 2 or 3 weeks. Botulism toxin is expensive and you should be sure of what costs you will incur resulting from the injection.  The side effects of botulism toxin use for chronic migraine may include:   -Transient, and usually mild, facial weakness with facial injections  -Transient, and usually mild, head or neck weakness with head/neck injections  -Reduction or loss of forehead facial animation due to forehead muscle weakness  -Eyelid drooping  -Dry eye  -Pain at  the site of injection or bruising at the site of injection  -Double vision  -Potential unknown long term risks  Contraindications: You should not have Botox if you are pregnant, nursing,  allergic to albumin, have an infection, skin condition, or muscle weakness at the site of the injection, or have myasthenia gravis, Lambert-Eaton syndrome, or ALS.  It is also possible that as with any injection, there may be an allergic reaction or no effect from the medication. Reduced effectiveness after repeated injections is sometimes seen and rarely infection at the injection site may occur. All care will be taken to prevent these side effects. If therapy is given over a long time, atrophy and wasting in the muscle injected may occur. Occasionally the patient's become refractory to treatment because they develop antibodies to the toxin. In this event, therapy needs to be modified.  I have read the above information and consent to the administration of botulism toxin.    BOTOX PROCEDURE NOTE FOR MIGRAINE HEADACHE    Contraindications and precautions discussed with patient(above). Aseptic procedure was observed and patient tolerated procedure. Procedure performed by Dr. Artemio Aly  The condition has existed for more than 6 months, and pt does not have a diagnosis of ALS, Myasthenia Gravis or Lambert-Eaton Syndrome.  Risks and benefits of injections discussed and pt agrees to proceed with the procedure.  Written consent obtained  These injections are medically necessary. Pt  receives good benefits from these injections. These injections do not cause sedations or hallucinations which the oral therapies may cause.  Description of procedure:  The patient was placed in a sitting position. The standard protocol was used for Botox as follows, with 5 units of Botox injected at each site:   -Procerus muscle, midline injection  -Corrugator muscle, bilateral injection  -Frontalis muscle, bilateral injection, with 2 sites each side, medial injection was performed in the upper one third of the frontalis muscle, in the region vertical from the medial inferior edge of the superior orbital rim. The  lateral injection was again in the upper one third of the forehead vertically above the lateral limbus of the cornea, 1.5 cm lateral to the medial injection site.  -Temporalis muscle injection, 4 sites, bilaterally. The first injection was 3 cm above the tragus of the ear, second injection site was 1.5 cm to 3 cm up from the first injection site in line with the tragus of the ear. The third injection site was 1.5-3 cm forward between the first 2 injection sites. The fourth injection site was 1.5 cm posterior to the second injection site.   -Occipitalis muscle injection, 3 sites, bilaterally. The first injection was done one half way between the occipital protuberance and the tip of the mastoid process behind the ear. The second injection site was done lateral and superior to the first, 1 fingerbreadth from the first injection. The third injection site was 1 fingerbreadth superiorly and medially from the first injection site.  -Cervical paraspinal muscle injection, 2 sites, bilateral knee first injection site was 1 cm from the midline of the cervical spine, 3 cm inferior to the lower border of the occipital protuberance. The second injection site was 1.5 cm superiorly and laterally to the first injection site.  -Trapezius muscle injection was performed at 3 sites, bilaterally. The first injection site was in the upper trapezius muscle halfway between the inflection point of the neck, and the acromion. The second injection site was one half way between the acromion and the first injection site.  The third injection was done between the first injection site and the inflection point of the neck.   Will return for repeat injection in 3 months.   155 units of Botox was used, 45u Botox not injected was wasted. The patient tolerated the procedure well, there were no complications of the above procedure.

## 2023-05-07 NOTE — Progress Notes (Signed)
Botox- 200 units x 1 vial Lot: [id number] Expiration: 06/2025 NDC: 6301-6010-93   Bacteriostatic 0.9% Sodium Chloride- 4  mL  Lot: [id number] Expiration: 01/19/2024 NDC: 2202-5427-06   Dx: C37.628  B/B Witnessed by Deno Lunger. CMA

## 2023-05-10 ENCOUNTER — Other Ambulatory Visit (HOSPITAL_COMMUNITY): Payer: Self-pay

## 2023-05-30 ENCOUNTER — Other Ambulatory Visit (HOSPITAL_COMMUNITY): Payer: Self-pay

## 2023-05-30 ENCOUNTER — Other Ambulatory Visit: Payer: Self-pay

## 2023-07-02 ENCOUNTER — Other Ambulatory Visit: Payer: Self-pay

## 2023-07-02 ENCOUNTER — Other Ambulatory Visit (HOSPITAL_COMMUNITY): Payer: Self-pay

## 2023-07-02 ENCOUNTER — Other Ambulatory Visit: Payer: Self-pay | Admitting: Neurology

## 2023-07-05 ENCOUNTER — Other Ambulatory Visit (HOSPITAL_COMMUNITY): Payer: Self-pay

## 2023-07-05 ENCOUNTER — Other Ambulatory Visit: Payer: Self-pay

## 2023-07-05 MED ORDER — UBRELVY 100 MG PO TABS
100.0000 mg | ORAL_TABLET | ORAL | 11 refills | Status: DC | PRN
  Filled 2023-07-05: qty 16, 30d supply, fill #0
  Filled 2023-09-17: qty 16, 30d supply, fill #1

## 2023-07-16 ENCOUNTER — Other Ambulatory Visit: Payer: Self-pay

## 2023-07-25 ENCOUNTER — Other Ambulatory Visit: Payer: Self-pay

## 2023-07-30 ENCOUNTER — Other Ambulatory Visit: Payer: Self-pay

## 2023-07-30 ENCOUNTER — Other Ambulatory Visit: Payer: Self-pay | Admitting: Pharmacy Technician

## 2023-07-30 NOTE — Progress Notes (Signed)
 Specialty Pharmacy Refill Coordination Note  Heidi Walker is a 43 y.o. female contacted today regarding refills of specialty medication(s) OnabotulinumtoxinA  (Botox )   Patient requested Courier to Provider Office   Delivery date: 08/01/23   Verified address: [facility] [address], [place], [place] [address]   Medication will be filled on 07/31/23.

## 2023-07-31 ENCOUNTER — Other Ambulatory Visit: Payer: Self-pay

## 2023-07-31 ENCOUNTER — Other Ambulatory Visit (HOSPITAL_COMMUNITY): Payer: Self-pay

## 2023-08-06 ENCOUNTER — Telehealth: Payer: Self-pay | Admitting: Neurology

## 2023-08-06 ENCOUNTER — Ambulatory Visit: Payer: 59 | Admitting: Neurology

## 2023-08-06 NOTE — Telephone Encounter (Signed)
 LVM and sent mychart msg informing pt of cancellation of 08/06/23 appt - MD out   If patient calls back to reschedule I have held 5/23 at 9:45am with Dr. Tresia Fruit for her

## 2023-08-10 ENCOUNTER — Ambulatory Visit (INDEPENDENT_AMBULATORY_CARE_PROVIDER_SITE_OTHER): Admitting: Neurology

## 2023-08-10 ENCOUNTER — Encounter: Payer: Self-pay | Admitting: Neurology

## 2023-08-10 VITALS — BP 110/73 | HR 96

## 2023-08-10 DIAGNOSIS — G43711 Chronic migraine without aura, intractable, with status migrainosus: Secondary | ICD-10-CM | POA: Diagnosis not present

## 2023-08-10 MED ORDER — ONABOTULINUMTOXINA 200 UNITS IJ SOLR
155.0000 [IU] | Freq: Once | INTRAMUSCULAR | Status: AC
  Administered 2023-08-10: 155 [IU] via INTRAMUSCULAR

## 2023-08-10 NOTE — Progress Notes (Signed)
 Botox - 200 units x 1 vial Lot: [id number] Expiration: 12/2025 NDC: 5409-8119-14   Bacteriostatic 0.9% Sodium Chloride- 4 mL  Lot: [id number] Expiration: 01/19/2024 NDC: 2130-8657-84   Dx: O96.295 SP Witnessed by Logan Rings RN

## 2023-08-10 NOTE — Progress Notes (Signed)
 Consent Form Botulism Toxin Injection For Chronic Migraine 08/10/2023: stable 05/07/2023: Patient does excellent with botox  >50% improvement with botox . She has 6 remaining migraines and 10 remaining total headache days a month which are relieved by qulipta . Being on both has been excellent.   02/07/2023:  Try to get her frovatriptan  for her menstrual migraines. She has a migraine every day during her cycle.  Frovatriptan  is use doing menses for menstrual migraines because it is a 26-hour half-life and 1 dose in the morning for every day of her menses can increase the quality of her life tremendously because we can avoid daily severe migraines.  She has also tried every other migraine > 3 months for this including: Rizatriptan  10mg , sumatriptan 100mg  (injection and oral and nasal), zolmitriptan 5mg , Eletriptan 40mg , naratriptan 2.5, almotriptan 12.5, frovatriptan  2.5 is the only one that has worked and significantly improves her quality of life for her menstrual migraines  Patient feels that her migraines cause her jaw to ache in her jaw aching also can cause her migraines to worsen it is a trigger for migraines included 10 units in each masseter to see if that helps with migraine severity.   11/14/2022: doing fantastic, stabvle or improved 08/22/2022: stable 05/30/2022: stable 02/28/2022: doing great, stable 12/06/2021: stable ding great 09/13/2021: stable, doing great 06/13/2021: Stable, still doing great; Patient is doing excellent! She now has4-6 migraine/total headache days a month 11/21/2o22: stable, still doing excellent. >80% improvement reduced to 4 headache/imigraine days a month. Episodic migraines while on botox , gave Qulipta  to help further. Has not had ajovy in months and doing phenomenal. . Will continue botox  and qulipta . Ubrelvy  prn. She does not clench. Botox , ubrelvy ,qulipta  all approved 02/2021.    Notes: Doesn't like nurtec as much*. Discussed elyxyb and Trudessa and Reyvow as  well. Will refill frova  and she had a side effect to topiramate . Already tried multiple triptans (sumatriptan, maxalt , almotriptan, eletriptan). Also discussed toradol injections as a possibility.     07/26/2020: Patient is doing excellent! She now has4-6 migraine/total headache days a month, technically episodic migraines, adding on Qulipta  for better migraine control in episodic migraine.  Doing excellent, > 80% improvement in migraine frequency. Loves qulipta  and Ajovy. Together she is in the best place she has ever been with migraine management.  No orders of the defined types were placed in this encounter.     Reviewed orally with patient, additionally signature is on file:  Botulism toxin has been approved by the Federal drug administration for treatment of chronic migraine. Botulism toxin does not cure chronic migraine and it may not be effective in some patients.  The administration of botulism toxin is accomplished by injecting a small amount of toxin into the muscles of the neck and head. Dosage must be titrated for each individual. Any benefits resulting from botulism toxin tend to wear off after 3 months with a repeat injection required if benefit is to be maintained. Injections are usually done every 3-4 months with maximum effect peak achieved by about 2 or 3 weeks. Botulism toxin is expensive and you should be sure of what costs you will incur resulting from the injection.  The side effects of botulism toxin use for chronic migraine may include:   -Transient, and usually mild, facial weakness with facial injections  -Transient, and usually mild, head or neck weakness with head/neck injections  -Reduction or loss of forehead facial animation due to forehead muscle weakness  -Eyelid drooping  -Dry eye  -Pain  at the site of injection or bruising at the site of injection  -Double vision  -Potential unknown long term risks  Contraindications: You should not have Botox  if you are  pregnant, nursing, allergic to albumin, have an infection, skin condition, or muscle weakness at the site of the injection, or have myasthenia gravis, Lambert-Eaton syndrome, or ALS.  It is also possible that as with any injection, there may be an allergic reaction or no effect from the medication. Reduced effectiveness after repeated injections is sometimes seen and rarely infection at the injection site may occur. All care will be taken to prevent these side effects. If therapy is given over a long time, atrophy and wasting in the muscle injected may occur. Occasionally the patient's become refractory to treatment because they develop antibodies to the toxin. In this event, therapy needs to be modified.  I have read the above information and consent to the administration of botulism toxin.    BOTOX  PROCEDURE NOTE FOR MIGRAINE HEADACHE    Contraindications and precautions discussed with patient(above). Aseptic procedure was observed and patient tolerated procedure. Procedure performed by Dr. Criselda Dolly  The condition has existed for more than 6 months, and pt does not have a diagnosis of ALS, Myasthenia Gravis or Lambert-Eaton Syndrome.  Risks and benefits of injections discussed and pt agrees to proceed with the procedure.  Written consent obtained  These injections are medically necessary. Pt  receives good benefits from these injections. These injections do not cause sedations or hallucinations which the oral therapies may cause.  Description of procedure:  The patient was placed in a sitting position. The standard protocol was used for Botox  as follows, with 5 units of Botox  injected at each site:   -Procerus muscle, midline injection  -Corrugator muscle, bilateral injection  -Frontalis muscle, bilateral injection, with 2 sites each side, medial injection was performed in the upper one third of the frontalis muscle, in the region vertical from the medial inferior edge of the superior  orbital rim. The lateral injection was again in the upper one third of the forehead vertically above the lateral limbus of the cornea, 1.5 cm lateral to the medial injection site.  -Temporalis muscle injection, 4 sites, bilaterally. The first injection was 3 cm above the tragus of the ear, second injection site was 1.5 cm to 3 cm up from the first injection site in line with the tragus of the ear. The third injection site was 1.5-3 cm forward between the first 2 injection sites. The fourth injection site was 1.5 cm posterior to the second injection site.   -Occipitalis muscle injection, 3 sites, bilaterally. The first injection was done one half way between the occipital protuberance and the tip of the mastoid process behind the ear. The second injection site was done lateral and superior to the first, 1 fingerbreadth from the first injection. The third injection site was 1 fingerbreadth superiorly and medially from the first injection site.  -Cervical paraspinal muscle injection, 2 sites, bilateral knee first injection site was 1 cm from the midline of the cervical spine, 3 cm inferior to the lower border of the occipital protuberance. The second injection site was 1.5 cm superiorly and laterally to the first injection site.  -Trapezius muscle injection was performed at 3 sites, bilaterally. The first injection site was in the upper trapezius muscle halfway between the inflection point of the neck, and the acromion. The second injection site was one half way between the acromion and the first injection  site. The third injection was done between the first injection site and the inflection point of the neck.   Will return for repeat injection in 3 months.   155 units of Botox  was used, 45u Botox  not injected was wasted. The patient tolerated the procedure well, there were no complications of the above procedure.

## 2023-08-15 ENCOUNTER — Other Ambulatory Visit: Payer: Self-pay

## 2023-10-03 ENCOUNTER — Other Ambulatory Visit (HOSPITAL_COMMUNITY): Payer: Self-pay

## 2023-10-09 ENCOUNTER — Telehealth: Payer: Self-pay | Admitting: Neurology

## 2023-10-09 NOTE — Telephone Encounter (Signed)
 Submitted auth request via CMM, status is pending. Key: [id number]

## 2023-10-15 NOTE — Telephone Encounter (Signed)
 Received approval, pt will continue to fill through [facility].  Auth#: [id number] (10/31/23-10/29/24)

## 2023-10-18 ENCOUNTER — Other Ambulatory Visit: Payer: Self-pay

## 2023-10-23 ENCOUNTER — Other Ambulatory Visit: Payer: Self-pay

## 2023-10-23 NOTE — Progress Notes (Signed)
 Specialty Pharmacy Refill Coordination Note  Heidi Walker is a 43 y.o. female assessed today regarding refills of clinic administered specialty medication(s) OnabotulinumtoxinA  (Botox )   Clinic requested Courier to Provider Office   Delivery date: 10/29/23   Verified address: [facility] [address], [place], [place] [address]   Medication will be filled on 08.08.25.     Appointment 8.18.25

## 2023-10-25 ENCOUNTER — Other Ambulatory Visit: Payer: Self-pay

## 2023-11-05 ENCOUNTER — Other Ambulatory Visit (HOSPITAL_COMMUNITY): Payer: Self-pay

## 2023-11-05 ENCOUNTER — Ambulatory Visit: Admitting: Neurology

## 2023-11-05 ENCOUNTER — Other Ambulatory Visit: Payer: Self-pay

## 2023-11-05 DIAGNOSIS — F3342 Major depressive disorder, recurrent, in full remission: Secondary | ICD-10-CM | POA: Diagnosis not present

## 2023-11-05 DIAGNOSIS — F411 Generalized anxiety disorder: Secondary | ICD-10-CM | POA: Diagnosis not present

## 2023-11-05 MED ORDER — LORAZEPAM 0.5 MG PO TABS
0.2500 mg | ORAL_TABLET | Freq: Three times a day (TID) | ORAL | 2 refills | Status: AC | PRN
  Filled 2023-11-05: qty 30, 10d supply, fill #0
  Filled 2024-02-19: qty 30, 10d supply, fill #1
  Filled 2024-03-18: qty 30, 10d supply, fill #2

## 2023-11-05 MED ORDER — ESCITALOPRAM OXALATE 20 MG PO TABS
20.0000 mg | ORAL_TABLET | Freq: Every day | ORAL | 1 refills | Status: AC
  Filled 2023-11-05 – 2023-11-10 (×2): qty 90, 90d supply, fill #0
  Filled 2024-02-26: qty 90, 90d supply, fill #1

## 2023-11-05 MED ORDER — BUPROPION HCL ER (XL) 150 MG PO TB24
150.0000 mg | ORAL_TABLET | Freq: Every morning | ORAL | 1 refills | Status: AC
  Filled 2023-11-05 – 2024-01-08 (×2): qty 90, 90d supply, fill #0
  Filled 2024-04-05: qty 90, 90d supply, fill #1

## 2023-11-07 ENCOUNTER — Ambulatory Visit (INDEPENDENT_AMBULATORY_CARE_PROVIDER_SITE_OTHER): Admitting: Neurology

## 2023-11-07 VITALS — BP 110/74 | HR 100

## 2023-11-07 DIAGNOSIS — G43831 Menstrual migraine, intractable, with status migrainosus: Secondary | ICD-10-CM

## 2023-11-07 DIAGNOSIS — G43711 Chronic migraine without aura, intractable, with status migrainosus: Secondary | ICD-10-CM | POA: Diagnosis not present

## 2023-11-07 DIAGNOSIS — G43009 Migraine without aura, not intractable, without status migrainosus: Secondary | ICD-10-CM

## 2023-11-07 DIAGNOSIS — R1112 Projectile vomiting: Secondary | ICD-10-CM

## 2023-11-07 MED ORDER — ONABOTULINUMTOXINA 200 UNITS IJ SOLR
155.0000 [IU] | Freq: Once | INTRAMUSCULAR | Status: AC
Start: 2023-11-07 — End: 2023-11-07
  Administered 2023-11-07: 155 [IU] via INTRAMUSCULAR

## 2023-11-07 NOTE — Progress Notes (Unsigned)
 Consent Form Botulism Toxin Injection For Chronic Migraine  11/07/2023: stable, doing extremely well, only 4 migraine days a month and < 8 total headache days a month, continue current medications Meds ordered this encounter  Medications   botulinum toxin Type A  (BOTOX ) injection 155 Units    Botox - 200 units x 1 vial Lot: [id number] Expiration: 01/2026 NDC: 9976-6078-97  Bacteriostatic 0.9% Sodium Chloride- 4 mL  Lot: [id number] Expiration: 12/2024 NDC: 9590-8033-97  Dx: G43.711 S/P  Witnessed by Heather PEAK   Atogepant  (QULIPTA ) 60 MG TABS    Sig: Take 1 tablet (60 mg total) by mouth daily.    Dispense:  30 tablet    Refill:  11    Please use copay card:[id number] PCN [id number] ID [id number]   frovatriptan  (FROVA ) 2.5 MG tablet    Sig: Take 1 tablet (2.5 mg total) by mouth as needed for migraine. If recurs, may repeat after 2 hours. Max of 3 tabs in 24 hours.    Dispense:  9 tablet    Refill:  3    She has also tried every other migraine > 3 months for this menstrual migraine: Rizatriptan  10mg , sumatriptan 100mg  (injection and oral and nasal), zolmitriptan 5mg , Eletriptan 40mg , naratriptan 2.5, almotriptan 12.5   ondansetron  (ZOFRAN ) 8 MG tablet    Sig: Take 1 tablet (8 mg total) by mouth every 8 (eight) hours as needed for nausea or vomiting.    Dispense:  20 tablet    Refill:  11   Ubrogepant  (UBRELVY ) 100 MG TABS    Sig: Take 1 tablet (100 mg total) by mouth as needed (may repeat in 2 hours. Max 2/24 hours.).    Dispense:  16 tablet    Refill:  11    Patient can use a savings card regardless of insurance denial or approval.tried eletriptan, rizatriptan ,sumatriptan,zolmitriptan,almotriptan    08/10/2023: stable 05/07/2023: Patient does excellent with botox  >50% improvement with botox . She has 6 remaining migraines and 10 remaining total headache days a month which are relieved by qulipta . Being on both has been excellent.   02/07/2023:  Try to get her  frovatriptan  for her menstrual migraines. She has a migraine every day during her cycle.  Frovatriptan  is use doing menses for menstrual migraines because it is a 26-hour half-life and 1 dose in the morning for every day of her menses can increase the quality of her life tremendously because we can avoid daily severe migraines.  She has also tried every other migraine > 3 months for this including: Rizatriptan  10mg , sumatriptan 100mg  (injection and oral and nasal), zolmitriptan 5mg , Eletriptan 40mg , naratriptan 2.5, almotriptan 12.5, frovatriptan  2.5 is the only one that has worked and significantly improves her quality of life for her menstrual migraines  Patient feels that her migraines cause her jaw to ache in her jaw aching also can cause her migraines to worsen it is a trigger for migraines included 10 units in each masseter to see if that helps with migraine severity.   11/14/2022: doing fantastic, stabvle or improved 08/22/2022: stable 05/30/2022: stable 02/28/2022: doing great, stable 12/06/2021: stable ding great 09/13/2021: stable, doing great 06/13/2021: Stable, still doing great; Patient is doing excellent! She now has4-6 migraine/total headache days a month 11/21/2o22: stable, still doing excellent. >80% improvement reduced to 4 headache/imigraine days a month. Episodic migraines while on botox , gave Qulipta  to help further. Has not had ajovy in months and doing phenomenal. . Will continue botox  and qulipta . Ubrelvy  prn. She does not  clench. Botox , ubrelvy ,qulipta  all approved 02/2021.    Notes: Doesn't like nurtec as much*. Discussed elyxyb and Trudessa and Reyvow as well. Will refill frova  and she had a side effect to topiramate . Already tried multiple triptans (sumatriptan, maxalt , almotriptan, eletriptan). Also discussed toradol injections as a possibility.     07/26/2020: Patient is doing excellent! She now has4-6 migraine/total headache days a month, technically episodic migraines,  adding on Qulipta  for better migraine control in episodic migraine.  Doing excellent, > 80% improvement in migraine frequency. Loves qulipta  and Ajovy. Together she is in the best place she has ever been with migraine management.  Meds ordered this encounter  Medications   botulinum toxin Type A  (BOTOX ) injection 155 Units    Botox - 200 units x 1 vial Lot: [id number] Expiration: 01/2026 NDC: 9976-6078-97  Bacteriostatic 0.9% Sodium Chloride- 4 mL  Lot: [id number] Expiration: 12/2024 NDC: 9590-8033-97  Dx: H56.288 S/P  Witnessed by Heather PEAK      Reviewed orally with patient, additionally signature is on file:  Botulism toxin has been approved by the Federal drug administration for treatment of chronic migraine. Botulism toxin does not cure chronic migraine and it may not be effective in some patients.  The administration of botulism toxin is accomplished by injecting a small amount of toxin into the muscles of the neck and head. Dosage must be titrated for each individual. Any benefits resulting from botulism toxin tend to wear off after 3 months with a repeat injection required if benefit is to be maintained. Injections are usually done every 3-4 months with maximum effect peak achieved by about 2 or 3 weeks. Botulism toxin is expensive and you should be sure of what costs you will incur resulting from the injection.  The side effects of botulism toxin use for chronic migraine may include:   -Transient, and usually mild, facial weakness with facial injections  -Transient, and usually mild, head or neck weakness with head/neck injections  -Reduction or loss of forehead facial animation due to forehead muscle weakness  -Eyelid drooping  -Dry eye  -Pain at the site of injection or bruising at the site of injection  -Double vision  -Potential unknown long term risks  Contraindications: You should not have Botox  if you are pregnant, nursing, allergic to albumin, have an infection, skin  condition, or muscle weakness at the site of the injection, or have myasthenia gravis, Lambert-Eaton syndrome, or ALS.  It is also possible that as with any injection, there may be an allergic reaction or no effect from the medication. Reduced effectiveness after repeated injections is sometimes seen and rarely infection at the injection site may occur. All care will be taken to prevent these side effects. If therapy is given over a long time, atrophy and wasting in the muscle injected may occur. Occasionally the patient's become refractory to treatment because they develop antibodies to the toxin. In this event, therapy needs to be modified.  I have read the above information and consent to the administration of botulism toxin.    BOTOX  PROCEDURE NOTE FOR MIGRAINE HEADACHE    Contraindications and precautions discussed with patient(above). Aseptic procedure was observed and patient tolerated procedure. Procedure performed by Dr. Andree Epp  The condition has existed for more than 6 months, and pt does not have a diagnosis of ALS, Myasthenia Gravis or Lambert-Eaton Syndrome.  Risks and benefits of injections discussed and pt agrees to proceed with the procedure.  Written consent obtained  These injections are medically necessary. Pt  receives good benefits from these injections. These injections do not cause sedations or hallucinations which the oral therapies may cause.  Description of procedure:  The patient was placed in a sitting position. The standard protocol was used for Botox  as follows, with 5 units of Botox  injected at each site:   -Procerus muscle, midline injection  -Corrugator muscle, bilateral injection  -Frontalis muscle, bilateral injection, with 2 sites each side, medial injection was performed in the upper one third of the frontalis muscle, in the region vertical from the medial inferior edge of the superior orbital rim. The lateral injection was again in the upper one  third of the forehead vertically above the lateral limbus of the cornea, 1.5 cm lateral to the medial injection site.  -Temporalis muscle injection, 4 sites, bilaterally. The first injection was 3 cm above the tragus of the ear, second injection site was 1.5 cm to 3 cm up from the first injection site in line with the tragus of the ear. The third injection site was 1.5-3 cm forward between the first 2 injection sites. The fourth injection site was 1.5 cm posterior to the second injection site.   -Occipitalis muscle injection, 3 sites, bilaterally. The first injection was done one half way between the occipital protuberance and the tip of the mastoid process behind the ear. The second injection site was done lateral and superior to the first, 1 fingerbreadth from the first injection. The third injection site was 1 fingerbreadth superiorly and medially from the first injection site.  -Cervical paraspinal muscle injection, 2 sites, bilateral knee first injection site was 1 cm from the midline of the cervical spine, 3 cm inferior to the lower border of the occipital protuberance. The second injection site was 1.5 cm superiorly and laterally to the first injection site.  -Trapezius muscle injection was performed at 3 sites, bilaterally. The first injection site was in the upper trapezius muscle halfway between the inflection point of the neck, and the acromion. The second injection site was one half way between the acromion and the first injection site. The third injection was done between the first injection site and the inflection point of the neck.   Will return for repeat injection in 3 months.   155 units of Botox  was used, 45u Botox  not injected was wasted. The patient tolerated the procedure well, there were no complications of the above procedure.

## 2023-11-07 NOTE — Progress Notes (Unsigned)
 Botox - 200 units x 1 vial Lot: [id number] Expiration: 01/2026 NDC: 9976-6078-97  Bacteriostatic 0.9% Sodium Chloride- 4 mL  Lot: [id number] Expiration: 12/2024 NDC: 9590-8033-97  Dx: G43.711 S/P  Witnessed by Heather PEAK

## 2023-11-08 ENCOUNTER — Other Ambulatory Visit (HOSPITAL_COMMUNITY): Payer: Self-pay

## 2023-11-08 ENCOUNTER — Other Ambulatory Visit (HOSPITAL_BASED_OUTPATIENT_CLINIC_OR_DEPARTMENT_OTHER): Payer: Self-pay

## 2023-11-08 ENCOUNTER — Encounter: Payer: Self-pay | Admitting: Neurology

## 2023-11-08 MED ORDER — FROVATRIPTAN SUCCINATE 2.5 MG PO TABS
2.5000 mg | ORAL_TABLET | ORAL | 3 refills | Status: DC | PRN
  Filled 2023-11-08: qty 9, 30d supply, fill #0

## 2023-11-08 MED ORDER — UBRELVY 100 MG PO TABS
100.0000 mg | ORAL_TABLET | ORAL | 11 refills | Status: AC | PRN
  Filled 2023-11-08: qty 16, 30d supply, fill #0
  Filled 2023-12-09: qty 16, 30d supply, fill #1
  Filled 2024-01-22: qty 16, 30d supply, fill #2
  Filled 2024-02-19: qty 16, 30d supply, fill #3
  Filled 2024-03-18: qty 16, 30d supply, fill #4
  Filled 2024-04-05 – 2024-04-11 (×4): qty 16, 30d supply, fill #5

## 2023-11-08 MED ORDER — QULIPTA 60 MG PO TABS
60.0000 mg | ORAL_TABLET | Freq: Every day | ORAL | 11 refills | Status: AC
  Filled 2023-11-08: qty 30, 30d supply, fill #0
  Filled 2023-12-09: qty 30, 30d supply, fill #1
  Filled 2024-01-09: qty 30, 30d supply, fill #2
  Filled 2024-02-05: qty 30, 30d supply, fill #3
  Filled 2024-03-06 – 2024-03-16 (×2): qty 30, 30d supply, fill #4
  Filled 2024-04-05 – 2024-04-10 (×3): qty 30, 30d supply, fill #5

## 2023-11-08 MED ORDER — ONDANSETRON HCL 8 MG PO TABS
8.0000 mg | ORAL_TABLET | Freq: Three times a day (TID) | ORAL | 11 refills | Status: AC | PRN
  Filled 2023-11-08: qty 20, 7d supply, fill #0
  Filled 2024-04-05: qty 20, 7d supply, fill #1

## 2023-11-09 ENCOUNTER — Telehealth (HOSPITAL_COMMUNITY): Payer: Self-pay | Admitting: Pharmacy Technician

## 2023-11-09 ENCOUNTER — Other Ambulatory Visit: Payer: Self-pay

## 2023-11-09 ENCOUNTER — Encounter (HOSPITAL_COMMUNITY): Payer: Self-pay

## 2023-11-09 ENCOUNTER — Other Ambulatory Visit (HOSPITAL_COMMUNITY): Payer: Self-pay

## 2023-11-10 ENCOUNTER — Other Ambulatory Visit (HOSPITAL_COMMUNITY): Payer: Self-pay

## 2023-11-12 ENCOUNTER — Encounter: Payer: Self-pay | Admitting: Neurology

## 2023-11-12 ENCOUNTER — Telehealth (HOSPITAL_COMMUNITY): Payer: Self-pay

## 2023-11-12 ENCOUNTER — Other Ambulatory Visit (HOSPITAL_COMMUNITY): Payer: Self-pay

## 2023-11-12 NOTE — Telephone Encounter (Signed)
 PA request has been Received. New Encounter has been or will be created for follow up. For additional info see Pharmacy Prior Auth telephone encounter from 11/12/23.

## 2023-11-12 NOTE — Telephone Encounter (Signed)
 Pharmacy Patient Advocate Encounter   Received notification from Pt Calls Messages that prior authorization for Frovatriptan  Succinate 2.5MG  tablets  is required/requested.   Insurance verification completed.   The patient is insured through [facility] .   Per test claim: Attempted to do PA but got this message from the insurance company:

## 2023-11-21 DIAGNOSIS — E039 Hypothyroidism, unspecified: Secondary | ICD-10-CM | POA: Diagnosis not present

## 2023-11-28 DIAGNOSIS — E039 Hypothyroidism, unspecified: Secondary | ICD-10-CM | POA: Diagnosis not present

## 2023-12-09 ENCOUNTER — Other Ambulatory Visit (HOSPITAL_COMMUNITY): Payer: Self-pay

## 2023-12-10 ENCOUNTER — Other Ambulatory Visit: Payer: Self-pay | Admitting: Family Medicine

## 2023-12-10 DIAGNOSIS — Z1231 Encounter for screening mammogram for malignant neoplasm of breast: Secondary | ICD-10-CM

## 2023-12-14 ENCOUNTER — Telehealth: Payer: Self-pay | Admitting: Pharmacist

## 2023-12-14 NOTE — Telephone Encounter (Signed)
 Pharmacy Patient Advocate Encounter   Received notification from CoverMyMeds that prior authorization for Qulipta  60MG  tablets is required/requested.   Insurance verification completed.   The patient is insured through [facility] .   Per test claim: PA required; PA submitted to above mentioned insurance via Latent Key/confirmation #/EOC [id number] Status is pending

## 2023-12-14 NOTE — Telephone Encounter (Signed)
 Pharmacy Patient Advocate Encounter  Received notification from [facility] that Prior Authorization for QULIPTA  60 MG PO TABS has been APPROVED from 12/14/2023 to 12/12/2024   PA #/Case ID/Reference #: [id number]

## 2023-12-31 ENCOUNTER — Ambulatory Visit
Admission: RE | Admit: 2023-12-31 | Discharge: 2023-12-31 | Disposition: A | Discharge status: Home / Self Care | Source: Ambulatory Visit | Attending: Family Medicine | Admitting: Family Medicine

## 2023-12-31 DIAGNOSIS — Z1231 Encounter for screening mammogram for malignant neoplasm of breast: Secondary | ICD-10-CM

## 2024-01-08 ENCOUNTER — Other Ambulatory Visit (HOSPITAL_COMMUNITY): Payer: Self-pay

## 2024-01-11 ENCOUNTER — Other Ambulatory Visit: Payer: Self-pay

## 2024-02-04 ENCOUNTER — Other Ambulatory Visit: Payer: Self-pay

## 2024-02-04 ENCOUNTER — Other Ambulatory Visit (HOSPITAL_COMMUNITY): Payer: Self-pay

## 2024-02-04 MED ORDER — FLUZONE 0.5 ML IM SUSY
0.5000 mL | PREFILLED_SYRINGE | Freq: Once | INTRAMUSCULAR | 0 refills | Status: AC
  Filled 2024-02-04: qty 0.5, 1d supply, fill #0

## 2024-02-04 MED ORDER — COVID-19 MRNA VAC-TRIS(PFIZER) 30 MCG/0.3ML IM SUSY
0.3000 mL | PREFILLED_SYRINGE | Freq: Once | INTRAMUSCULAR | 0 refills | Status: AC
  Filled 2024-02-04: qty 0.3, 1d supply, fill #0

## 2024-02-04 NOTE — Progress Notes (Signed)
 Specialty Pharmacy Refill Coordination Note  Heidi Walker is a 43 y.o. female assessed today regarding refills of clinic administered specialty medication(s) OnabotulinumtoxinA  (Botox )   Clinic requested Courier to Provider Office   Delivery date: 02/13/24   Verified address: [facility] [address], [place], [place] [address]   Medication will be filled on: 02/12/24

## 2024-02-06 ENCOUNTER — Ambulatory Visit: Admitting: Neurology

## 2024-02-06 ENCOUNTER — Other Ambulatory Visit: Payer: Self-pay

## 2024-02-06 NOTE — Progress Notes (Signed)
  Specialty Pharmacy Refill Coordination Note   Heidi Walker is a 43 y.o. female assessed today regarding refills of clinic administered specialty medication(s) OnabotulinumtoxinA  (Botox )     Clinic requested Courier to Provider Office   Delivery date: 02/12/24   Verified address: [facility] [address], [place], [place] [address]     Medication will be filled on: 02/11/24       Fill date and delivery date adjusted for upcoming holiday.

## 2024-02-07 ENCOUNTER — Other Ambulatory Visit (HOSPITAL_COMMUNITY): Payer: Self-pay

## 2024-02-11 ENCOUNTER — Other Ambulatory Visit: Payer: Self-pay

## 2024-02-17 ENCOUNTER — Other Ambulatory Visit (HOSPITAL_COMMUNITY): Payer: Self-pay

## 2024-02-19 ENCOUNTER — Telehealth (HOSPITAL_COMMUNITY): Payer: Self-pay

## 2024-02-19 ENCOUNTER — Ambulatory Visit (INDEPENDENT_AMBULATORY_CARE_PROVIDER_SITE_OTHER): Admitting: Neurology

## 2024-02-19 ENCOUNTER — Other Ambulatory Visit (HOSPITAL_COMMUNITY): Payer: Self-pay

## 2024-02-19 ENCOUNTER — Other Ambulatory Visit: Payer: Self-pay

## 2024-02-19 VITALS — BP 111/77 | HR 94

## 2024-02-19 DIAGNOSIS — G43711 Chronic migraine without aura, intractable, with status migrainosus: Secondary | ICD-10-CM

## 2024-02-19 DIAGNOSIS — G43009 Migraine without aura, not intractable, without status migrainosus: Secondary | ICD-10-CM

## 2024-02-19 DIAGNOSIS — G43831 Menstrual migraine, intractable, with status migrainosus: Secondary | ICD-10-CM

## 2024-02-19 MED ORDER — PROCHLORPERAZINE MALEATE 10 MG PO TABS
10.0000 mg | ORAL_TABLET | Freq: Four times a day (QID) | ORAL | 1 refills | Status: AC | PRN
  Filled 2024-02-19: qty 30, 8d supply, fill #0

## 2024-02-19 MED ORDER — FROVATRIPTAN SUCCINATE 2.5 MG PO TABS
2.5000 mg | ORAL_TABLET | ORAL | 3 refills | Status: AC | PRN
  Filled 2024-02-19 – 2024-03-18 (×2): qty 9, 30d supply, fill #0

## 2024-02-19 MED ORDER — ONABOTULINUMTOXINA 200 UNITS IJ SOLR
155.0000 [IU] | Freq: Once | INTRAMUSCULAR | Status: AC
  Administered 2024-02-19: 155 [IU] via INTRAMUSCULAR

## 2024-02-19 NOTE — Progress Notes (Signed)
 Botox - 200 units x 1 vial Lot: [id number] Expiration: 2028/03 NDC: 9976-6078-97  Bacteriostatic 0.9% Sodium Chloride- 4 mL  Lot: [id number] Expiration: 01/17/25 NDC: 9590803397  Dx: H56.288, G43.009, G43.831 S/P  Witnessed by DELON RMA

## 2024-02-19 NOTE — Progress Notes (Signed)
   BOTOX  PROCEDURE NOTE FOR MIGRAINE HEADACHE   HISTORY: Heidi Walker is here for Botox . Last was 11/07/23 with Dr. Ines. Also on Qulipta  for migraine prevention. Ubrelvy /Frova  for migraine rescue. With her migraines, can last several days, Frova  works well but insurance denies. May have about 10 headache days a month, only 1-2 are severe where she cannot function. More migraines lately, has noticed since 2 weeks overdue for Botox .   Description of procedure:  The patient was placed in a sitting position. The standard protocol was used for Botox  as follows, with 5 units of Botox  injected at each site:   -Procerus muscle, midline injection  -Corrugator muscle, bilateral injection  -Frontalis muscle, bilateral injection, with 2 sites each side, medial injection was performed in the upper one third of the frontalis muscle, in the region vertical from the medial inferior edge of the superior orbital rim. The lateral injection was again in the upper one third of the forehead vertically above the lateral limbus of the cornea, 1.5 cm lateral to the medial injection site.  -Temporalis muscle injection, 4 sites, bilaterally. The first injection was 3 cm above the tragus of the ear, second injection site was 1.5 cm to 3 cm up from the first injection site in line with the tragus of the ear. The third injection site was 1.5-3 cm forward between the first 2 injection sites. The fourth injection site was 1.5 cm posterior to the second injection site.  -Occipitalis muscle injection, 3 sites, bilaterally. The first injection was done one half way between the occipital protuberance and the tip of the mastoid process behind the ear. The second injection site was done lateral and superior to the first, 1 fingerbreadth from the first injection. The third injection site was 1 fingerbreadth superiorly and medially from the first injection site.  -Cervical paraspinal muscle injection, 2 sites, bilateral, the  first injection site was 1 cm from the midline of the cervical spine, 3 cm inferior to the lower border of the occipital protuberance. The second injection site was 1.5 cm superiorly and laterally to the first injection site.  -Trapezius muscle injection was performed at 3 sites, bilaterally. The first injection site was in the upper trapezius muscle halfway between the inflection point of the neck, and the acromion. The second injection site was one half way between the acromion and the first injection site. The third injection was done between the first injection site and the inflection point of the neck.   A 200 unit bottle of Botox  was used, 155 units were injected, the rest of the Botox  was wasted. The patient tolerated the procedure well, there were no complications of the above procedure.  Botox  NDC 9976-6078-97 Lot number [id number] Expiration date 05/2026 SP  Continue current medications. Qulipta  for migraine prevention. Frova /Ubrelvy  for acute migraine, compazine  for prolonged migraine. She will establish with Dr. Margaret.

## 2024-02-20 ENCOUNTER — Other Ambulatory Visit (HOSPITAL_BASED_OUTPATIENT_CLINIC_OR_DEPARTMENT_OTHER): Payer: Self-pay

## 2024-02-20 ENCOUNTER — Telehealth (HOSPITAL_COMMUNITY): Payer: Self-pay

## 2024-02-20 ENCOUNTER — Other Ambulatory Visit (HOSPITAL_COMMUNITY): Payer: Self-pay

## 2024-02-20 NOTE — Telephone Encounter (Signed)
 Pharmacy Patient Advocate Encounter   Received notification from Pt Calls Messages that prior authorization for Frovatriptan  2.5 mg tablet is required/requested.   Insurance verification completed.   The patient is insured through [facility].   Per test claim: Per test claim, medication is not covered due to plan/benefit exclusion, PA not submitted at this time     *rizatriptan  or sumatriptan is preferred but this is a plan exclusion so they will not approve prior auth.

## 2024-02-20 NOTE — Telephone Encounter (Signed)
 PA request has been Received. New Encounter has been or will be created for follow up. For additional info see Pharmacy Prior Auth telephone encounter from 02/20/24.

## 2024-02-26 ENCOUNTER — Other Ambulatory Visit (HOSPITAL_COMMUNITY): Payer: Self-pay

## 2024-03-14 ENCOUNTER — Other Ambulatory Visit (HOSPITAL_COMMUNITY): Payer: Self-pay

## 2024-03-16 ENCOUNTER — Other Ambulatory Visit (HOSPITAL_COMMUNITY): Payer: Self-pay

## 2024-03-18 ENCOUNTER — Other Ambulatory Visit: Payer: Self-pay

## 2024-04-05 ENCOUNTER — Other Ambulatory Visit (HOSPITAL_COMMUNITY): Payer: Self-pay

## 2024-04-08 ENCOUNTER — Other Ambulatory Visit (HOSPITAL_COMMUNITY): Payer: Self-pay

## 2024-04-08 ENCOUNTER — Other Ambulatory Visit: Payer: Self-pay

## 2024-04-10 ENCOUNTER — Other Ambulatory Visit (HOSPITAL_COMMUNITY): Payer: Self-pay

## 2024-04-23 ENCOUNTER — Other Ambulatory Visit: Payer: Self-pay

## 2024-04-23 ENCOUNTER — Other Ambulatory Visit (HOSPITAL_COMMUNITY): Payer: Self-pay

## 2024-04-23 MED ORDER — LEVOTHYROXINE SODIUM 88 MCG PO TABS
88.0000 ug | ORAL_TABLET | Freq: Every morning | ORAL | 5 refills | Status: AC
  Filled 2024-04-23 – 2024-04-24 (×3): qty 30, 30d supply, fill #0

## 2024-04-24 ENCOUNTER — Other Ambulatory Visit: Payer: Self-pay

## 2024-04-24 ENCOUNTER — Other Ambulatory Visit (HOSPITAL_COMMUNITY): Payer: Self-pay

## 2024-05-14 ENCOUNTER — Ambulatory Visit: Admitting: Neurology

## 2024-05-22 ENCOUNTER — Ambulatory Visit: Admitting: Neurology

## 2025-02-23 ENCOUNTER — Ambulatory Visit: Admitting: Diagnostic Neuroimaging
